# Patient Record
Sex: Female | Born: 1985 | Race: White | Hispanic: No | Marital: Married | Smoking: Never smoker
Health system: Southern US, Community
[De-identification: ages and names within clinical notes are randomized; demographics above are authoritative.]

## PROBLEM LIST (undated history)

## (undated) ENCOUNTER — Inpatient Hospital Stay (HOSPITAL_COMMUNITY): Payer: Self-pay

## (undated) DIAGNOSIS — F419 Anxiety disorder, unspecified: Secondary | ICD-10-CM

## (undated) DIAGNOSIS — F32A Depression, unspecified: Secondary | ICD-10-CM

## (undated) DIAGNOSIS — G43909 Migraine, unspecified, not intractable, without status migrainosus: Secondary | ICD-10-CM

## (undated) DIAGNOSIS — R87629 Unspecified abnormal cytological findings in specimens from vagina: Secondary | ICD-10-CM

## (undated) HISTORY — PX: BUNIONECTOMY: SHX129

## (undated) HISTORY — PX: COLPOSCOPY: SHX161

## (undated) HISTORY — PX: TYMPANOSTOMY TUBE PLACEMENT: SHX32

## (undated) HISTORY — PX: ADENOIDECTOMY: SUR15

## (undated) HISTORY — DX: Unspecified abnormal cytological findings in specimens from vagina: R87.629

## (undated) HISTORY — DX: Depression, unspecified: F32.A

## (undated) HISTORY — PX: EXTERNAL EAR SURGERY: SHX627

---

## 2012-10-22 ENCOUNTER — Encounter: Payer: Self-pay | Admitting: Internal Medicine

## 2012-10-27 ENCOUNTER — Emergency Department (HOSPITAL_COMMUNITY)
Admission: EM | Admit: 2012-10-27 | Discharge: 2012-10-27 | Disposition: A | Discharge status: Home / Self Care | Payer: 59 | Attending: Emergency Medicine | Admitting: Emergency Medicine

## 2012-10-27 ENCOUNTER — Encounter (HOSPITAL_COMMUNITY): Payer: Self-pay | Admitting: *Deleted

## 2012-10-27 DIAGNOSIS — M2569 Stiffness of other specified joint, not elsewhere classified: Secondary | ICD-10-CM | POA: Insufficient documentation

## 2012-10-27 DIAGNOSIS — R059 Cough, unspecified: Secondary | ICD-10-CM | POA: Insufficient documentation

## 2012-10-27 DIAGNOSIS — Z8679 Personal history of other diseases of the circulatory system: Secondary | ICD-10-CM | POA: Insufficient documentation

## 2012-10-27 DIAGNOSIS — J029 Acute pharyngitis, unspecified: Secondary | ICD-10-CM

## 2012-10-27 DIAGNOSIS — R05 Cough: Secondary | ICD-10-CM | POA: Insufficient documentation

## 2012-10-27 DIAGNOSIS — Z791 Long term (current) use of non-steroidal anti-inflammatories (NSAID): Secondary | ICD-10-CM | POA: Insufficient documentation

## 2012-10-27 DIAGNOSIS — M79609 Pain in unspecified limb: Secondary | ICD-10-CM | POA: Insufficient documentation

## 2012-10-27 HISTORY — DX: Migraine, unspecified, not intractable, without status migrainosus: G43.909

## 2012-10-27 NOTE — ED Notes (Signed)
Pt c/o facial swelling to lower jaw line that began last pm; denies difficulty breathing; pt c/o throat discomfort

## 2012-10-28 NOTE — ED Provider Notes (Signed)
CSN: [id number]     Arrival date & time 10/27/12  2201 History   First MD Initiated Contact with Patient 10/27/12 2247     Chief Complaint  Patient presents with  . Facial Swelling   (Consider location/radiation/quality/duration/timing/severity/associated sxs/prior Treatment) HPI  Marieelena Bartko is a 27 y.o. female complaining of  Lower facial swelling, myalgia, dry cough, neck stiffness and sore throat worsening over the course of the last 24 hours. Patient denies difficulty swallowing, fever, or shortness of breath. Patient has taken Benadryl at home with little relief, she thinks she may be having an allergic reaction to her newly started migraine prophylactic medication Lodine. She DC it several days ago.  Past Medical History  Diagnosis Date  . Migraines    Past Surgical History  Procedure Laterality Date  . External ear surgery     No family history on file. History  Substance Use Topics  . Smoking status: Never Smoker   . Smokeless tobacco: Not on file  . Alcohol Use: Yes     Comment: rarely   OB History   Grav Para Term Preterm Abortions TAB SAB Ect Mult Living                 Review of Systems 10 systems reviewed and found to be negative, except as noted in the HPI   Allergies  Review of patient's allergies indicates no known allergies.  Home Medications   Current Outpatient Rx  Name  Route  Sig  Dispense  Refill  . etodolac (LODINE) 400 MG tablet   Oral   Take 400 mg by mouth 2 (two) times daily.         Marland Kitchen levonorgestrel-ethinyl estradiol (AVIANE,ALESSE,LESSINA) 0.1-20 MG-MCG tablet   Oral   Take 1 tablet by mouth daily.          BP 120/75  Pulse 65  Temp(Src) 98.1 F (36.7 C) (Oral)  Resp 20  SpO2 98%  LMP 09/22/2012 Physical Exam  Nursing note and vitals reviewed. Constitutional: She is oriented to person, place, and time. She appears well-developed and well-nourished. No distress.  HENT:  Head: Normocephalic.  Mouth/Throat: Oropharynx  is clear and moist. No oropharyngeal exudate.  No facial swelling, no tenderness to palpation or induration under comment.  No tonsillar hypertrophy, posterior pharynx is erythematous  Eyes: Conjunctivae and EOM are normal. Pupils are equal, round, and reactive to light.  Neck:  Large anterior cervical lymphadenopathy, minimally tender  Cardiovascular: Normal rate, regular rhythm and intact distal pulses.   Pulmonary/Chest: Effort normal and breath sounds normal. No stridor. No respiratory distress. She has no wheezes. She has no rales. She exhibits no tenderness.  Abdominal: Soft. Bowel sounds are normal. She exhibits no distension and no mass. There is no tenderness. There is no rebound and no guarding.  Musculoskeletal: Normal range of motion.  Lymphadenopathy:    She has cervical adenopathy.  Neurological: She is alert and oriented to person, place, and time.  Skin: Skin is warm.  Psychiatric: She has a normal mood and affect.  nervous    ED Course  Procedures (including critical care time) Labs Review Labs Reviewed - No data to display Imaging Review No results found.  MDM   1. Viral pharyngitis    Filed Vitals:   10/27/12 2218 10/27/12 2327  BP: 135/73 120/75  Pulse: 75 65  Temp: 98.1 F (36.7 C) 98.1 F (36.7 C)  TempSrc: Oral Oral  Resp: 18 20  SpO2: 99% 98%  Amie Cowens is a 27 y.o. female with pharyngitis, myalgia, dry cough, and anterior cervical lymphadenopathy, physical exam is not consistent with strep, and this is a simple viral pharyngitis. Patient appears quite nervous I have reassured her that it is a viral syndrome and that she's not having an allergic reaction. I advised her to DC the Lodine until she passes the infection and then she can discuss restarting it with her neurologist.  Pt is hemodynamically stable, appropriate for, and amenable to discharge at this time. Pt verbalized understanding and agrees with care plan. All questions answered.  Outpatient follow-up and specific return precautions discussed.    Note: Portions of this report may have been transcribed using voice recognition software. Every effort was made to ensure accuracy; however, inadvertent computerized transcription errors may be present     Wynetta Emery, PA-C 10/28/12 0126

## 2012-10-28 NOTE — ED Provider Notes (Signed)
Medical screening examination/treatment/procedure(s) were performed by non-physician practitioner and as supervising physician I was immediately available for consultation/collaboration.  Milam Allbaugh L Jayme Mednick, MD 10/28/12 1938 

## 2012-11-15 ENCOUNTER — Encounter: Payer: Self-pay | Admitting: Internal Medicine

## 2012-11-19 ENCOUNTER — Ambulatory Visit (INDEPENDENT_AMBULATORY_CARE_PROVIDER_SITE_OTHER): Payer: 59 | Admitting: Internal Medicine

## 2012-11-19 ENCOUNTER — Encounter: Payer: Self-pay | Admitting: Internal Medicine

## 2012-11-19 ENCOUNTER — Other Ambulatory Visit: Payer: Self-pay | Admitting: Gastroenterology

## 2012-11-19 ENCOUNTER — Other Ambulatory Visit (INDEPENDENT_AMBULATORY_CARE_PROVIDER_SITE_OTHER): Payer: 59

## 2012-11-19 VITALS — BP 126/70 | HR 84 | Ht 68.0 in | Wt 143.0 lb

## 2012-11-19 DIAGNOSIS — R110 Nausea: Secondary | ICD-10-CM

## 2012-11-19 DIAGNOSIS — G43709 Chronic migraine without aura, not intractable, without status migrainosus: Secondary | ICD-10-CM

## 2012-11-19 DIAGNOSIS — R1013 Epigastric pain: Secondary | ICD-10-CM

## 2012-11-19 DIAGNOSIS — Z791 Long term (current) use of non-steroidal anti-inflammatories (NSAID): Secondary | ICD-10-CM

## 2012-11-19 DIAGNOSIS — IMO0002 Reserved for concepts with insufficient information to code with codable children: Secondary | ICD-10-CM

## 2012-11-19 LAB — CBC
MCHC: 34.6 g/dL (ref 30.0–36.0)
RDW: 12.8 % (ref 11.5–14.6)
WBC: 5.4 10*3/uL (ref 4.5–10.5)

## 2012-11-19 LAB — COMPREHENSIVE METABOLIC PANEL
ALT: 44 U/L — ABNORMAL HIGH (ref 0–35)
AST: 25 U/L (ref 0–37)
CO2: 28 mEq/L (ref 19–32)
Calcium: 9.9 mg/dL (ref 8.4–10.5)
Chloride: 109 mEq/L (ref 96–112)
GFR: 118.31 mL/min (ref >60.00)
Sodium: 141 mEq/L (ref 135–145)
Total Bilirubin: 0.6 mg/dL (ref 0.3–1.2)
Total Protein: 6.8 g/dL (ref 6.0–8.3)

## 2012-11-19 LAB — TSH: TSH: 0.46 u[IU]/mL (ref 0.35–5.50)

## 2012-11-19 MED ORDER — HYOSCYAMINE SULFATE 0.125 MG SL SUBL: 0.1250 mg | SUBLINGUAL_TABLET | SUBLINGUAL | Status: DC | PRN

## 2012-11-19 MED ORDER — PANTOPRAZOLE SODIUM 40 MG PO TBEC: 40.0000 mg | DELAYED_RELEASE_TABLET | Freq: Two times a day (BID) | ORAL | Status: DC

## 2012-11-19 MED ORDER — PANTOPRAZOLE SODIUM 40 MG PO TBEC: 40.0000 mg | DELAYED_RELEASE_TABLET | Freq: Every day | ORAL | Status: DC

## 2012-11-19 NOTE — Patient Instructions (Addendum)
Your physician has requested that you go to the basement for the following lab work before leaving today: CBC. Celiac panel, TSH, CMP  We have sent the following medications to your pharmacy for you to pick up at your convenience: Protonics, take twice a day with meals  Follow up in office in 8 weeks                                               We are excited to introduce MyChart, a new best-in-class service that provides you online access to important information in your electronic medical record. We want to make it easier for you to view your health information - all in one secure location - when and where you need it. We expect MyChart will enhance the quality of care and service we provide.  When you register for MyChart, you can:    View your test results.    Request appointments and receive appointment reminders via email.    Request medication renewals.    View your medical history, allergies, medications and immunizations.    Communicate with your physician's office through a password-protected site.    Conveniently print information such as your medication lists.  To find out if MyChart is right for you, please talk to a member of our clinical staff today. We will gladly answer your questions about this free health and wellness tool.  If you are age 27 or older and want a member of your family to have access to your record, you must provide written consent by completing a proxy form available at our office. Please speak to our clinical staff about guidelines regarding accounts for patients younger than age 27.  As you activate your MyChart account and need any technical assistance, please call the MyChart technical support line at [phone]-CHART [phone]) or email your question to [email or web address]. If you email your question(s), please include your name, a return phone number and the best time to reach you.  If you have non-urgent health-related questions, you  can send a message to our office through MyChart at [email or web address]. If you have a medical emergency, call 911.  Thank you for using MyChart as your new health and wellness resource!   MyChart licensed from [employer],  [id number]. Patents Pending.

## 2012-11-19 NOTE — Progress Notes (Signed)
Patient ID: Taylor Choi, female   DOB: [birth year], 27 y.o.   MRN: [id number] HPI: Taylor Choi is a 27 yo female with PMH of chronic migraines, followed at [facility], who is seen for evaluation of ongoing abdominal pain. The patient is here alone today. She reports "stomach pain" over the last year now occurring more frequently. She reports this can be quite severe. It is significantly worse after eating, though she knows she can occur on an anti-stomach. She has associated nausea but no vomiting. She reports the pain can be sharp and occasionally radiate through to her back. The pain she reports is worse above her umbilicus and usually in the epigastrium. It seems to worsen with stress. She has not tried any over-the-counter medications. She does report abdominal bloating. Overall appetite has been good though at times she avoids eating due to to fear of the pain. She reports normal bowel habits for her, though she reports somewhat say she is constipated. She reports having a bowel movement every 4-5 days. Bowel movement does not affect her abdominal pain. She denies blood in her stool or melena. She does have chronic migraines which are occurring on a daily basis. She is followed by Dr. Thomasena Edis her [facility] neurologist. She's tried multiple prophylactic medications in the past none of which helped. She even went through biofeedback. He reports significant stress at work due to her high paced job, but for now she plans to continue this job without change in career. Last period was normal occurring around 10/30/2012.  She is using daily NSAIDs for her chronic migraines. She was taken etodolac 400 mg twice daily for months, but stopped in early September when she developed sore throat and URI symptoms. There was some question of whether this was an allergic reaction to etodolac, but she reports her doctors determined it was a viral infection. She has not resumed the medication yet but she is using at least 800  mg of ibuprofen daily  She reports a family history of colon polyps in her father and diverticulosis/diverticulitis in her mother requiring partial colon resection. The family history of celiac disease, colon cancer or IBD (to her knowledge)  Past Medical History  Diagnosis Date  . Migraines     Past Surgical History  Procedure Laterality Date  . External ear surgery      Current Outpatient Prescriptions  Medication Sig Dispense Refill  . etodolac (LODINE) 400 MG tablet Take 400 mg by mouth 2 (two) times daily.      . pantoprazole (PROTONIX) 40 MG tablet Take 1 tablet (40 mg total) by mouth 2 (two) times daily before a meal.  60 tablet  6   No current facility-administered medications for this visit.    No Known Allergies  Family History  Problem Relation Age of Onset  . Diverticulitis Mother   . Diverticulitis Maternal Grandmother   . Colon polyps Father   . Diabetes Father   . Diverticulitis Cousin   . Colon cancer Neg Hx     History  Substance Use Topics  . Smoking status: Never Smoker   . Smokeless tobacco: Never Used  . Alcohol Use: No     Comment: rarely    ROS: As per history of present illness, otherwise negative  BP 126/70  Pulse 84  Ht 5\' 8"  (1.727 m)  Wt 143 lb (64.864 kg)  BMI 21.75 kg/m2  SpO2 98%  LMP 10/30/2012 Constitutional: Well-developed and well-nourished. No distress. HEENT: Normocephalic and atraumatic. Oropharynx is  clear and moist. No oropharyngeal exudate. Conjunctivae are normal.  No scleral icterus. Neck: Neck supple. Trachea midline. Cardiovascular: Normal rate, regular rhythm and intact distal pulses. No M/R/G Pulmonary/chest: Effort normal and breath sounds normal. No wheezing, rales or rhonchi. Abdominal: Soft, mild to moderate epigastric tenderness without rebound or guarding, nondistended. Bowel sounds active throughout. There are no masses palpable. No hepatosplenomegaly. Extremities: no clubbing, cyanosis, or  edema Lymphadenopathy: No cervical adenopathy noted. Neurological: Alert and oriented to person place and time. Skin: Skin is warm and dry. No rashes noted. Psychiatric: Normal mood and affect. Behavior is normal.  RELEVANT LABS --Labs ordered today CBC, CMP, celiac panel, TSH  ASSESSMENT/PLAN: 27 yo female with PMH of chronic migraines, followed at [facility], who is seen for evaluation of ongoing abdominal pain.  1.  Epigastric abd pain, nausea -- peptic ulcer disease or NSAID-related gastroduodenitis is at the top of the differential currently. Other considerations include biliary dyskinesia, celiac disease, IBS, or chronic constipation. I have recommended labs today to include celiac panel and thyroid-stimulating hormone. I would like to start her on PPI twice daily or one month and reassess her symptoms. If this improves her abdominal pain, and we will continue PPI on a once daily basis while she is using NSAIDs. If her pain fails to completely improve I will plan upper endoscopy. She is aware of this recommendation. Also if her symptoms fail to improve we will perform abdominal ultrasound to exclude gallstones. I would like to see her back in 4-6 weeks for reassessment, but sooner if things worsen or fail to improve. She understands and is happy with this plan. I will also give her prescription for Levsin to be used as needed for cramping abdominal pain.  2.  Migraines -- daily problem being followed by neurology. Currently she is using NSAIDs as abortive therapy, and we'll use PPI to protect her gastric and duodenal mucosa as described above. She asked about gluten and how it might relate to migraines. Certainly some patients have gluten intolerance without true celiac disease. Some patients with migraines notice improvement with gluten-free diet.  We will perform PPI trial before making any significant dietary modifications as it relates to her migraines

## 2013-01-13 ENCOUNTER — Encounter: Payer: Self-pay | Admitting: Internal Medicine

## 2013-01-15 ENCOUNTER — Ambulatory Visit (INDEPENDENT_AMBULATORY_CARE_PROVIDER_SITE_OTHER): Payer: 59 | Admitting: Internal Medicine

## 2013-01-15 ENCOUNTER — Other Ambulatory Visit (INDEPENDENT_AMBULATORY_CARE_PROVIDER_SITE_OTHER): Payer: 59

## 2013-01-15 ENCOUNTER — Encounter: Payer: Self-pay | Admitting: Internal Medicine

## 2013-01-15 VITALS — BP 114/70 | HR 68 | Ht 69.0 in | Wt 143.0 lb

## 2013-01-15 DIAGNOSIS — R7402 Elevation of levels of lactic acid dehydrogenase (LDH): Secondary | ICD-10-CM

## 2013-01-15 DIAGNOSIS — R1013 Epigastric pain: Secondary | ICD-10-CM

## 2013-01-15 DIAGNOSIS — R7401 Elevation of levels of liver transaminase levels: Secondary | ICD-10-CM

## 2013-01-15 DIAGNOSIS — R110 Nausea: Secondary | ICD-10-CM

## 2013-01-15 LAB — COMPREHENSIVE METABOLIC PANEL
ALT: 37 U/L — ABNORMAL HIGH (ref 0–35)
CO2: 26 mEq/L (ref 19–32)
Calcium: 10 mg/dL (ref 8.4–10.5)
Chloride: 105 mEq/L (ref 96–112)
GFR: 118.17 mL/min (ref >60.00)
Glucose, Bld: 91 mg/dL (ref 70–99)
Sodium: 136 mEq/L (ref 135–145)
Total Bilirubin: 0.8 mg/dL (ref 0.3–1.2)
Total Protein: 7.3 g/dL (ref 6.0–8.3)

## 2013-01-15 LAB — LIPASE: Lipase: 23 U/L (ref 11.0–59.0)

## 2013-01-15 MED ORDER — PANTOPRAZOLE SODIUM 40 MG PO TBEC: 40.0000 mg | DELAYED_RELEASE_TABLET | Freq: Every day | ORAL | Status: DC

## 2013-01-15 MED ORDER — HYOSCYAMINE SULFATE 0.125 MG SL SUBL: 0.1250 mg | SUBLINGUAL_TABLET | SUBLINGUAL | Status: DC | PRN

## 2013-01-15 NOTE — Patient Instructions (Signed)
You have been scheduled for an abdominal ultrasound at [facility] (1st floor of hospital) on 10/20/2012 at 8:30am. Please arrive 15 minutes prior to your appointment for registration. Make certain not to have anything to eat or drink 6 hours prior to your appointment. Should you need to reschedule your appointment, please contact radiology at [phone]. This test typically takes about 30 minutes to perform.   Continue taking levsin. Decrease Protonix to once daily.  Dr. Rhea Belton recommends taking Lactaid ( you can purchase this over the counter) wit any meal containing Lactose in it.  Follow up in office in 4-6 weeks.                                                We are excited to introduce MyChart, a new best-in-class service that provides you online access to important information in your electronic medical record. We want to make it easier for you to view your health information - all in one secure location - when and where you need it. We expect MyChart will enhance the quality of care and service we provide.  When you register for MyChart, you can:    View your test results.    Request appointments and receive appointment reminders via email.    Request medication renewals.    View your medical history, allergies, medications and immunizations.    Communicate with your physician's office through a password-protected site.    Conveniently print information such as your medication lists.  To find out if MyChart is right for you, please talk to a member of our clinical staff today. We will gladly answer your questions about this free health and wellness tool.  If you are age 27 or older and want a member of your family to have access to your record, you must provide written consent by completing a proxy form available at our office. Please speak to our clinical staff about guidelines regarding accounts for patients younger than age 89.  As you activate your MyChart account  and need any technical assistance, please call the MyChart technical support line at ([phone]) or email your question to [email or web address]. If you email your question(s), please include your name, a return phone number and the best time to reach you.  If you have non-urgent health-related questions, you can send a message to our office through MyChart at [email or web address]. If you have a medical emergency, call 911.  Thank you for using MyChart as your new health and wellness resource!   MyChart licensed from [employer],  [id number]. Patents Pending.

## 2013-01-15 NOTE — Progress Notes (Signed)
Subjective:    Patient ID: Taylor Choi, female    DOB: [birth year], 27 y.o.   MRN: [id number]  HPI Taylor Choi is a 27 yo female with PMH of migraines who is seen on 11/19/2012 to evaluate epigastric abdominal pain with nausea and is here for followup. At her last visit she was started on pantoprazole 40 mg twice daily with a working diagnosis of NSAID-related gastroduodenitis.  She reports her pain has improved and is no longer associated with eating. She is still having episodic upper abdominal pain which seems to happen" random" fashion. This pain does not relate to eating at this point. It can last one to 2 hours. She's tried Levsin for this pain it has helped a little. She has stopped using NSAIDs and is trying to use Tylenol for her migraines. She was previously described etodolac but never started using it. Her nausea has completely resolved. Bowel habits have not changed. She has noticed, which is long-standing and failed to mention last time, if she develops urgent loose stools after drinking milk. This also happens with coffee, but she reports she uses creamer. This has happened for years. She has tried to make some changes to her diet including cutting back on deli meats and breads. No fevers or chills. Pain is usually not nocturnal. No rashes. No mouth sores or ulcers.  Review of Systems As per history of present illness, otherwise negative  Current Medications, Allergies, Past Medical History, Past Surgical History, Family History and Social History were reviewed in [employer] record.     Objective:   Physical Exam BP 114/70  Pulse 68  Ht 5\' 9"  (1.753 m)  Wt 143 lb (64.864 kg)  BMI 21.11 kg/m2  LMP 12/14/2012 Constitutional: Well-developed and well-nourished. No distress. HEENT: Normocephalic and atraumatic. Oropharynx is clear and moist. No oropharyngeal exudate. Conjunctivae are normal.  No scleral icterus. Neck: Neck supple. Trachea  midline. Cardiovascular: Normal rate, regular rhythm and intact distal pulses. No M/R/G Pulmonary/chest: Effort normal and breath sounds normal. No wheezing, rales or rhonchi. Abdominal: Soft, epigastric and upper abdominal tenderness with deep palpation without rebound or guarding, nondistended. Bowel sounds active throughout. There are no masses palpable. No hepatosplenomegaly. Extremities: no clubbing, cyanosis, or edema Neurological: Alert and oriented to person place and time. Psychiatric: Normal mood and affect. Behavior is normal.  CBC    Component Value Date/Time   WBC 5.4 11/19/2012 0944   RBC 4.43 11/19/2012 0944   HGB 13.7 11/19/2012 0944   HCT 39.5 11/19/2012 0944   PLT 247.0 11/19/2012 0944   MCV 89.3 11/19/2012 0944   MCHC 34.6 11/19/2012 0944   RDW 12.8 11/19/2012 0944    CMP     Component Value Date/Time   NA 141 11/19/2012 0944   K 4.4 11/19/2012 0944   CL 109 11/19/2012 0944   CO2 28 11/19/2012 0944   GLUCOSE 88 11/19/2012 0944   BUN 10 11/19/2012 0944   CREATININE 0.6 11/19/2012 0944   CALCIUM 9.9 11/19/2012 0944   PROT 6.8 11/19/2012 0944   ALBUMIN 4.1 11/19/2012 0944   AST 25 11/19/2012 0944   ALT 44* 11/19/2012 0944   ALKPHOS 51 11/19/2012 0944   BILITOT 0.6 11/19/2012 0944    Celiac panel -- negative  TSH - normal    Assessment & Plan:   27 yo female with PMH of migraines who is seen on 11/19/2012 to evaluate epigastric abdominal pain with nausea and is here for followup.  1.  Epigastric/upper abd  pain -- her pain has improved to some degree though she continues to have episodic upper abdominal pain without clear trigger. This is despite 2 months of twice a day PPI.  She also has cut out NSAIDs. At this point given her symptoms have improved but yet continued to some degree, I have recommended an abdominal ultrasound. This will help rule out gallstones. I will reduce her pantoprazole to once daily. She can continue to use Levsin as needed. I would like to repeat labs  today to include a CMP, lipase and H. pylori antibody. If her ultrasound is negative and her symptoms fail to improve further, my recommendation will be for upper endoscopy. She understands this plan and is happy  2.  Elevated ALT -- very mild elevation in ALT only. Unclear etiology, I am rechecking liver enzymes today and getting [place] as mentioned above.

## 2013-01-20 ENCOUNTER — Other Ambulatory Visit: Payer: 59

## 2013-01-20 ENCOUNTER — Ambulatory Visit (HOSPITAL_COMMUNITY)
Admission: RE | Admit: 2013-01-20 | Discharge: 2013-01-20 | Disposition: A | Discharge status: Home / Self Care | Payer: 59 | Source: Ambulatory Visit | Attending: Internal Medicine | Admitting: Internal Medicine

## 2013-01-20 ENCOUNTER — Other Ambulatory Visit: Payer: Self-pay | Admitting: Gastroenterology

## 2013-01-20 DIAGNOSIS — R110 Nausea: Secondary | ICD-10-CM

## 2013-01-20 DIAGNOSIS — R1013 Epigastric pain: Secondary | ICD-10-CM

## 2013-01-20 DIAGNOSIS — R109 Unspecified abdominal pain: Secondary | ICD-10-CM

## 2013-01-21 LAB — HELICOBACTER PYLORI  SPECIAL ANTIGEN

## 2013-02-17 ENCOUNTER — Encounter: Payer: Self-pay | Admitting: Internal Medicine

## 2013-02-24 ENCOUNTER — Ambulatory Visit (INDEPENDENT_AMBULATORY_CARE_PROVIDER_SITE_OTHER): Payer: 59 | Admitting: Internal Medicine

## 2013-02-24 ENCOUNTER — Encounter: Payer: Self-pay | Admitting: Internal Medicine

## 2013-02-24 VITALS — BP 92/60 | HR 80 | Ht 69.0 in | Wt 141.5 lb

## 2013-02-24 DIAGNOSIS — R1013 Epigastric pain: Secondary | ICD-10-CM

## 2013-02-24 DIAGNOSIS — E739 Lactose intolerance, unspecified: Secondary | ICD-10-CM | POA: Insufficient documentation

## 2013-02-24 NOTE — Patient Instructions (Signed)
Follow up if your epigastric pain or nausea return.                                               We are excited to introduce MyChart, a new best-in-class service that provides you online access to important information in your electronic medical record. We want to make it easier for you to view your health information - all in one secure location - when and where you need it. We expect MyChart will enhance the quality of care and service we provide.  When you register for MyChart, you can:    View your test results.    Request appointments and receive appointment reminders via email.    Request medication renewals.    View your medical history, allergies, medications and immunizations.    Communicate with your physician's office through a password-protected site.    Conveniently print information such as your medication lists.  To find out if MyChart is right for you, please talk to a member of our clinical staff today. We will gladly answer your questions about this free health and wellness tool.  If you are age 27 or older and want a member of your family to have access to your record, you must provide written consent by completing a proxy form available at our office. Please speak to our clinical staff about guidelines regarding accounts for patients younger than age 26.  As you activate your MyChart account and need any technical assistance, please call the MyChart technical support line at [phone] 83-CHART [phone]) or email your question to [email or web address]. If you email your question(s), please include your name, a return phone number and the best time to reach you.  If you have non-urgent health-related questions, you can send a message to our office through MyChart at [email or web address]. If you have a medical emergency, call 911.  Thank you for using MyChart as your new health and wellness resource!   MyChart licensed from [employer],   [id number]. Patents Pending.

## 2013-02-24 NOTE — Progress Notes (Signed)
   Subjective:    Patient ID: Taylor Choi, female    DOB: Mar 03, 1985, 27 y.o.   MRN: [id number]  HPI Taylor Choi is a 27 yo female with PMH of migraines who is seen in followup for epigastric pain with nausea. At her last visit she had a complete abdominal ultrasound which was normal.  He also decreased her pantoprazole to 40 mg once daily. She has continued to avoid NSAIDs, though she is still having migraines. She occasionally uses Tylenol or prescription triptan for her headaches. She reports further improvement in her epigastric pain. No further nausea after eating. She did have an episode of diffuse abdominal discomfort with bloating and loose stools on Christmas Eve. She reports this was after eating the soup that contained milk at her mother's home.  She was unaware of the soup contained milk before this meal.  Other than that she feels better overall. Appetite is good. No vomiting. No heartburn. No blood in her stool or melena.   Review of Systems As per history of present illness, otherwise negative  Current Medications, Allergies, Past Medical History, Past Surgical History, Family History and Social History were reviewed in [employer] record.     Objective:   Physical Exam BP 92/60  Pulse 80  Ht 5\' 9"  (1.753 m)  Wt 141 lb 8 oz (64.184 kg)  BMI 20.89 kg/m2 Constitutional: Well-developed and well-nourished. No distress. HEENT: Normocephalic and atraumatic.No scleral icterus. Psychiatric: Normal mood and affect. Behavior is normal.  CLINICAL DATA:  Epigastric pain   EXAM: ULTRASOUND ABDOMEN COMPLETE -- 01/21/2013   COMPARISON:  None.   FINDINGS: Gallbladder   No gallstones or wall thickening visualized. No sonographic Murphy sign noted.   Common bile duct   Diameter: 2.1 mm   Liver   No focal lesion identified. Within normal limits in parenchymal echogenicity.   IVC   No abnormality visualized.   Pancreas   Visualized portion  unremarkable.   Spleen   Size and appearance within normal limits.   Right Kidney   Length: 10.9 cm. Echogenicity within normal limits. No mass or hydronephrosis visualized.   Left Kidney   Length: 10.9 cm. Echogenicity within normal limits. No mass or hydronephrosis visualized.   Abdominal aorta   No aneurysm visualized.   IMPRESSION: Normal abdominal ultrasound.     Assessment & Plan:  27 yo female with PMH of migraines who is seen in followup for epigastric pain with nausea.  1.  Epigastric pain -- further improvement to this point. Ultrasound was reviewed and negative. The working diagnosis was gastroduodenitis, perhaps PUD associated with NSAID use. This has resolved and she is avoiding NSAIDs. Given her improvement I do not think upper endoscopy or further workup is warranted. She understands and agrees with this plan.  She will complete her current prescription for pantoprazole and then discontinue this medication. If her epigastric pain or nausea return after discontinuing this medication, I asked that she notify me. She voices understanding.  2.  Lactose intolerance -- I do think she has lactose intolerance and we discussed this again today. She understands that the lactose containing meal can contribute to abdominal discomfort, pain, bloating, loose stools. She can use over-the-counter Lactaid tablets if she eats lactose-containing food or drinks milk.  Otherwise she will work to avoid lactose altogether.  She can followup as needed, and as above

## 2013-04-21 LAB — OB RESULTS CONSOLE ANTIBODY SCREEN: Antibody Screen: NEGATIVE

## 2013-04-21 LAB — OB RESULTS CONSOLE ABO/RH: RH Type: POSITIVE

## 2013-04-21 LAB — OB RESULTS CONSOLE GC/CHLAMYDIA
CHLAMYDIA, DNA PROBE: NEGATIVE
GC PROBE AMP, GENITAL: NEGATIVE

## 2013-04-21 LAB — OB RESULTS CONSOLE HIV ANTIBODY (ROUTINE TESTING): HIV: NONREACTIVE

## 2013-04-21 LAB — OB RESULTS CONSOLE RPR: RPR: NONREACTIVE

## 2013-04-21 LAB — OB RESULTS CONSOLE RUBELLA ANTIBODY, IGM: RUBELLA: IMMUNE

## 2013-04-21 LAB — OB RESULTS CONSOLE HEPATITIS B SURFACE ANTIGEN: Hepatitis B Surface Ag: NEGATIVE

## 2013-08-25 ENCOUNTER — Encounter (HOSPITAL_COMMUNITY): Payer: Self-pay | Admitting: *Deleted

## 2013-08-25 ENCOUNTER — Inpatient Hospital Stay (HOSPITAL_COMMUNITY): Payer: 59

## 2013-08-25 ENCOUNTER — Inpatient Hospital Stay (HOSPITAL_COMMUNITY)
Admission: AD | Admit: 2013-08-25 | Discharge: 2013-08-25 | Disposition: A | Discharge status: Home / Self Care | Payer: 59 | Source: Ambulatory Visit | Attending: Obstetrics and Gynecology | Admitting: Obstetrics and Gynecology

## 2013-08-25 DIAGNOSIS — R071 Chest pain on breathing: Secondary | ICD-10-CM | POA: Insufficient documentation

## 2013-08-25 DIAGNOSIS — R0602 Shortness of breath: Secondary | ICD-10-CM | POA: Insufficient documentation

## 2013-08-25 DIAGNOSIS — O9989 Other specified diseases and conditions complicating pregnancy, childbirth and the puerperium: Principal | ICD-10-CM

## 2013-08-25 DIAGNOSIS — O99891 Other specified diseases and conditions complicating pregnancy: Secondary | ICD-10-CM | POA: Insufficient documentation

## 2013-08-25 NOTE — MAU Provider Note (Signed)
Chief Complaint:  Shortness of Breath, Pain under left breast  and Pain down entire left arm    First Provider Initiated Contact with Patient 08/25/13 1034      HPI: Rod MaeLindsay A Hoey is a 28 y.o. G1P0 at 3527w3d who presents to maternity admissions reporting shortness of breathe and pain under left breast and into left arm.  Pt reports she is not currently coughing, but had URI over the weekend with coughing.  She has pain when lifting her left arm.  She reports good fetal movement, denies regular contractions, LOF, vaginal bleeding, vaginal itching/burning, urinary symptoms, h/a, dizziness, n/v, or fever/chills.    Past Medical History: Past Medical History  Diagnosis Date  . Migraines     Past obstetric history: OB History  Gravida Para Term Preterm AB SAB TAB Ectopic Multiple Living  1             # Outcome Date GA Lbr Len/2nd Weight Sex Delivery Anes PTL Lv  1 CUR               Past Surgical History: Past Surgical History  Procedure Laterality Date  . External ear surgery      Family History: Family History  Problem Relation Age of Onset  . Diverticulitis Mother   . Diverticulitis Maternal Grandmother   . Colon polyps Father   . Diabetes Father   . Diverticulitis Cousin   . Colon cancer Neg Hx     Social History: History  Substance Use Topics  . Smoking status: Never Smoker   . Smokeless tobacco: Never Used  . Alcohol Use: No     Comment: rarely    Allergies: No Known Allergies  Meds:  Prescriptions prior to admission  Medication Sig Dispense Refill  . Prenatal Vit-Fe Fumarate-FA (PRENATAL MULTIVITAMIN) TABS tablet Take 1 tablet by mouth daily at 12 noon.        ROS: Pertinent findings in history of present illness.  Physical Exam  Blood pressure 116/66, pulse 91, temperature 98.1 F (36.7 C), temperature source Oral, resp. rate 16, height 5\' 9"  (1.753 m), weight 71.215 kg (157 lb), last menstrual period 02/28/2013, SpO2 97.00%. GENERAL:  Well-developed, well-nourished female in no acute distress.  HEENT: normocephalic HEART: normal rate, rhythm, heart sounds RESP: normal effort, lung sounds clear and equal bilaterally CHEST: tenderness to palpation at second intercostal, midclavicular, also tenderness with raising of left arm ABDOMEN: Soft, non-tender, gravid appropriate for gestational age EXTREMITIES: Nontender, no edema NEURO: alert and oriented    FHT:  Baseline 145, moderate variability, accelerations present, no decelerations Contractions: None on toco or to palpation   EKG normal  Chest X-Ray pending  Report to Joseph BerkshireJulie Ethier, PA     Medication List    ASK your doctor about these medications       prenatal multivitamin Tabs tablet  Take 1 tablet by mouth daily at 12 noon.        Sharen CounterLisa Leftwich-Kirby Certified Nurse-Midwife 08/25/2013 11:23 AM  MDM Called Dr. Henderson Cloudomblin with CXR results. He will come to MAU to evaluate the patient for discharge  A: SIUP at 7627w3d Chest pain SOB  P: Dr. Henderson Cloudomblin in MAU to assess patient and discharged home  Freddi StarrJulie N Ethier, PA-C  08/25/2013 1:17 PM

## 2013-08-25 NOTE — Progress Notes (Signed)
28 yo G1P0 at 25 3/7 wks C/O pain in left chest for about 2 days, waxes and wanes. Had some cough over weekend although not a bad cough. No fever, no pain in legs. Some "SOB" in that she needed to take a deep breath and fill her lungs, no wheezing or productive cough.  PE , patient smiling in NAD        Lungs CTA        Cor RRR        Abd-epigastrum NT, uterus soft and NT        LE-bilat-no cords, negative Homan's        Chest-tender along left costal margin and in intercostal spaces below breast. Also mildly tender along left pectoralis.  Left breast tissue is NT without masses, no erythema or retraction  O2 sat 97% RA EKG NSR CXR WNL  A: Costo chondral pain  P: Tylenol prn      Heat to chest prn      Call if sxs worsen or change      FU in office as scheduled

## 2013-08-25 NOTE — MAU Note (Signed)
Patient presents with complaints of SOB, pain under left breast and down her entire left arm since Friday.

## 2013-08-25 NOTE — MAU Note (Signed)
Urine is in lab 

## 2013-08-25 NOTE — MAU Note (Signed)
Assist Dr. Henderson Cloudomblin with breast exam

## 2013-08-25 NOTE — Discharge Instructions (Signed)
Per Dr. Henderson Cloudomblin: 1. Tylenol as needed for pain. 2. Heating pad for comfort. 3. Call if symptoms worsen.

## 2013-11-17 ENCOUNTER — Encounter (HOSPITAL_COMMUNITY): Payer: Self-pay | Admitting: *Deleted

## 2013-11-17 ENCOUNTER — Inpatient Hospital Stay (HOSPITAL_COMMUNITY)
Admission: AD | Admit: 2013-11-17 | Discharge: 2013-11-17 | Disposition: A | Discharge status: Home / Self Care | Payer: 59 | Source: Ambulatory Visit | Attending: Obstetrics and Gynecology | Admitting: Obstetrics and Gynecology

## 2013-11-17 DIAGNOSIS — O479 False labor, unspecified: Secondary | ICD-10-CM | POA: Insufficient documentation

## 2013-11-17 DIAGNOSIS — O99891 Other specified diseases and conditions complicating pregnancy: Secondary | ICD-10-CM | POA: Diagnosis present

## 2013-11-17 LAB — POCT FERN TEST: POCT Fern Test: NEGATIVE

## 2013-11-17 NOTE — Discharge Instructions (Signed)

## 2013-11-17 NOTE — MAU Note (Signed)
Patient states that about 1600 she had a watery ring of fluid on a panty liner. Feels constantly wet. Having irregular cramping. Reports good fetal movement.

## 2013-11-17 NOTE — MAU Provider Note (Signed)
S: 28 y.o. G1P0  presents to MAU with leakage of clear fluid. She reports a ring of clear fluid on her pantyliner today, not enough to soak the pantyliner or through to her underwear.  She also reports an increase in cramping and back pain today, but nothing regular.  She reports good fetal movement, denies vaginal bleeding, vaginal itching/burning, urinary symptoms, h/a, dizziness, n/v, or fever/chills.    O: BP 132/76  Pulse 85  Temp(Src) 99.2 F (37.3 C) (Oral)  Resp 16  Ht  (1.727 m)  Wt 75.569 kg (166 lb 9.6 oz)  BMI 25.34 kg/m2  SpO2 100%  LMP 02/28/2013  Dilation: Closed Effacement (%): 50 Cervical Position: Posterior Exam by:: Leftwich-Kirby CNM  Speculum Exam with negative pooling with valsalva, slide taken with negative ferning  A: Threatened labor at term  P: Consult Dr Renaldo Fiddler D/C home with labor precautions  Sharen Counter Certified Nurse-Midwife

## 2013-12-04 ENCOUNTER — Telehealth (HOSPITAL_COMMUNITY): Payer: Self-pay | Admitting: *Deleted

## 2013-12-04 ENCOUNTER — Encounter (HOSPITAL_COMMUNITY): Payer: Self-pay | Admitting: *Deleted

## 2013-12-04 LAB — OB RESULTS CONSOLE GBS: GBS: NEGATIVE

## 2013-12-04 NOTE — Telephone Encounter (Signed)
Preadmission screen  

## 2013-12-11 ENCOUNTER — Encounter (HOSPITAL_COMMUNITY): Payer: Self-pay

## 2013-12-11 ENCOUNTER — Inpatient Hospital Stay (HOSPITAL_COMMUNITY)
Admission: RE | Admit: 2013-12-11 | Discharge: 2013-12-14 | DRG: 766 | Disposition: A | Discharge status: Home / Self Care | Payer: 59 | Source: Ambulatory Visit | Attending: Obstetrics and Gynecology | Admitting: Obstetrics and Gynecology

## 2013-12-11 DIAGNOSIS — Z3A41 41 weeks gestation of pregnancy: Secondary | ICD-10-CM | POA: Diagnosis present

## 2013-12-11 DIAGNOSIS — Z98891 History of uterine scar from previous surgery: Secondary | ICD-10-CM

## 2013-12-11 DIAGNOSIS — O480 Post-term pregnancy: Principal | ICD-10-CM | POA: Diagnosis present

## 2013-12-11 DIAGNOSIS — Z833 Family history of diabetes mellitus: Secondary | ICD-10-CM | POA: Diagnosis not present

## 2013-12-11 LAB — TYPE AND SCREEN
ABO/RH(D): O POS
Antibody Screen: NEGATIVE

## 2013-12-11 LAB — CBC
HEMATOCRIT: 34.6 % — AB (ref 36.0–46.0)
HEMOGLOBIN: 12.3 g/dL (ref 12.0–15.0)
MCH: 32.3 pg (ref 26.0–34.0)
MCHC: 35.5 g/dL (ref 30.0–36.0)
MCV: 90.8 fL (ref 78.0–100.0)
Platelets: 212 10*3/uL (ref 150–400)
RBC: 3.81 MIL/uL — ABNORMAL LOW (ref 3.87–5.11)
RDW: 13 % (ref 11.5–15.5)
WBC: 11.5 10*3/uL — ABNORMAL HIGH (ref 4.0–10.5)

## 2013-12-11 MED ORDER — OXYCODONE-ACETAMINOPHEN 5-325 MG PO TABS: 2.0000 | ORAL_TABLET | ORAL | Status: DC | PRN

## 2013-12-11 MED ORDER — OXYTOCIN BOLUS FROM INFUSION: 500.0000 mL | INTRAVENOUS | Status: DC

## 2013-12-11 MED ORDER — LACTATED RINGERS IV SOLN: 500.0000 mL | INTRAVENOUS | Status: DC | PRN

## 2013-12-11 MED ORDER — OXYTOCIN 40 UNITS IN LACTATED RINGERS INFUSION - SIMPLE MED: 62.5000 mL/h | INTRAVENOUS | Status: DC

## 2013-12-11 MED ORDER — FLEET ENEMA 7-19 GM/118ML RE ENEM: 1.0000 | ENEMA | RECTAL | Status: DC | PRN

## 2013-12-11 MED ORDER — ACETAMINOPHEN 325 MG PO TABS: 650.0000 mg | ORAL_TABLET | ORAL | Status: DC | PRN

## 2013-12-11 MED ORDER — ONDANSETRON HCL 4 MG/2ML IJ SOLN
4.0000 mg | Freq: Four times a day (QID) | INTRAMUSCULAR | Status: DC | PRN
Start: 2013-12-11 — End: 2013-12-12

## 2013-12-11 MED ORDER — MISOPROSTOL 25 MCG QUARTER TABLET
25.0000 ug | ORAL_TABLET | ORAL | Status: DC | PRN
  Administered 2013-12-11: 25 ug via VAGINAL
  Filled 2013-12-11 (×3): qty 0.25

## 2013-12-11 MED ORDER — LACTATED RINGERS IV SOLN
INTRAVENOUS | Status: DC
  Administered 2013-12-11: 21:00:00 via INTRAVENOUS

## 2013-12-11 MED ORDER — CITRIC ACID-SODIUM CITRATE 334-500 MG/5ML PO SOLN
30.0000 mL | ORAL | Status: DC | PRN
  Administered 2013-12-12: 30 mL via ORAL
  Filled 2013-12-11: qty 15

## 2013-12-11 MED ORDER — LIDOCAINE HCL (PF) 1 % IJ SOLN: 30.0000 mL | INTRAMUSCULAR | Status: DC | PRN

## 2013-12-11 MED ORDER — OXYCODONE-ACETAMINOPHEN 5-325 MG PO TABS: 1.0000 | ORAL_TABLET | ORAL | Status: DC | PRN

## 2013-12-11 MED ORDER — TERBUTALINE SULFATE 1 MG/ML IJ SOLN: 0.2500 mg | Freq: Once | INTRAMUSCULAR | Status: AC | PRN

## 2013-12-11 MED ORDER — ZOLPIDEM TARTRATE 5 MG PO TABS: 5.0000 mg | ORAL_TABLET | Freq: Every evening | ORAL | Status: DC | PRN

## 2013-12-12 ENCOUNTER — Encounter (HOSPITAL_COMMUNITY): Payer: Self-pay

## 2013-12-12 ENCOUNTER — Encounter (HOSPITAL_COMMUNITY)
Admission: RE | Disposition: A | Discharge status: Home / Self Care | Payer: Self-pay | Source: Ambulatory Visit | Attending: Obstetrics and Gynecology

## 2013-12-12 ENCOUNTER — Encounter (HOSPITAL_COMMUNITY): Payer: 59 | Admitting: Anesthesiology

## 2013-12-12 ENCOUNTER — Inpatient Hospital Stay (HOSPITAL_COMMUNITY): Payer: 59 | Admitting: Anesthesiology

## 2013-12-12 DIAGNOSIS — Z98891 History of uterine scar from previous surgery: Secondary | ICD-10-CM

## 2013-12-12 LAB — ABO/RH: ABO/RH(D): O POS

## 2013-12-12 LAB — RPR

## 2013-12-12 SURGERY — Surgical Case
Anesthesia: Spinal

## 2013-12-12 MED ORDER — NALBUPHINE HCL 10 MG/ML IJ SOLN: 5.0000 mg | INTRAMUSCULAR | Status: DC | PRN

## 2013-12-12 MED ORDER — FENTANYL CITRATE 0.05 MG/ML IJ SOLN
INTRAMUSCULAR | Status: AC
  Filled 2013-12-12: qty 2

## 2013-12-12 MED ORDER — OXYTOCIN 10 UNIT/ML IJ SOLN
40.0000 [IU] | INTRAVENOUS | Status: DC | PRN
  Administered 2013-12-12: 40 [IU] via INTRAVENOUS

## 2013-12-12 MED ORDER — MORPHINE SULFATE (PF) 0.5 MG/ML IJ SOLN
INTRAMUSCULAR | Status: DC | PRN
  Administered 2013-12-12: .2 mg via INTRATHECAL

## 2013-12-12 MED ORDER — LACTATED RINGERS IV SOLN
INTRAVENOUS | Status: DC
  Administered 2013-12-12: 17:00:00 via INTRAVENOUS

## 2013-12-12 MED ORDER — ONDANSETRON HCL 4 MG/2ML IJ SOLN: 4.0000 mg | Freq: Three times a day (TID) | INTRAMUSCULAR | Status: DC | PRN

## 2013-12-12 MED ORDER — IBUPROFEN 600 MG PO TABS
600.0000 mg | ORAL_TABLET | Freq: Four times a day (QID) | ORAL | Status: DC | PRN
  Administered 2013-12-12: 600 mg via ORAL

## 2013-12-12 MED ORDER — MEPERIDINE HCL 25 MG/ML IJ SOLN
6.2500 mg | INTRAMUSCULAR | Status: DC | PRN
  Administered 2013-12-12: 6.25 mg via INTRAVENOUS

## 2013-12-12 MED ORDER — ONDANSETRON HCL 4 MG PO TABS: 4.0000 mg | ORAL_TABLET | ORAL | Status: DC | PRN

## 2013-12-12 MED ORDER — FENTANYL CITRATE 0.05 MG/ML IJ SOLN
25.0000 ug | INTRAMUSCULAR | Status: DC | PRN
  Administered 2013-12-12: 50 ug via INTRAVENOUS

## 2013-12-12 MED ORDER — SIMETHICONE 80 MG PO CHEW
80.0000 mg | CHEWABLE_TABLET | ORAL | Status: DC
  Administered 2013-12-12 – 2013-12-13 (×3): 80 mg via ORAL
  Filled 2013-12-12 (×2): qty 1

## 2013-12-12 MED ORDER — DIPHENHYDRAMINE HCL 25 MG PO CAPS: 25.0000 mg | ORAL_CAPSULE | Freq: Four times a day (QID) | ORAL | Status: DC | PRN

## 2013-12-12 MED ORDER — OXYCODONE-ACETAMINOPHEN 5-325 MG PO TABS: 2.0000 | ORAL_TABLET | ORAL | Status: DC | PRN

## 2013-12-12 MED ORDER — KETOROLAC TROMETHAMINE 30 MG/ML IJ SOLN: 30.0000 mg | Freq: Four times a day (QID) | INTRAMUSCULAR | Status: DC | PRN

## 2013-12-12 MED ORDER — FENTANYL CITRATE 0.05 MG/ML IJ SOLN
INTRAMUSCULAR | Status: DC | PRN
  Administered 2013-12-12: 12.5 ug via INTRATHECAL

## 2013-12-12 MED ORDER — LANOLIN HYDROUS EX OINT: 1.0000 "application " | TOPICAL_OINTMENT | CUTANEOUS | Status: DC | PRN

## 2013-12-12 MED ORDER — IBUPROFEN 600 MG PO TABS
600.0000 mg | ORAL_TABLET | Freq: Four times a day (QID) | ORAL | Status: DC
  Administered 2013-12-13 – 2013-12-14 (×7): 600 mg via ORAL
  Filled 2013-12-12 (×8): qty 1

## 2013-12-12 MED ORDER — DIBUCAINE 1 % RE OINT: 1.0000 "application " | TOPICAL_OINTMENT | RECTAL | Status: DC | PRN

## 2013-12-12 MED ORDER — MEPERIDINE HCL 25 MG/ML IJ SOLN
INTRAMUSCULAR | Status: AC
  Administered 2013-12-12: 6.25 mg via INTRAVENOUS
  Filled 2013-12-12: qty 1

## 2013-12-12 MED ORDER — LACTATED RINGERS IV SOLN
INTRAVENOUS | Status: DC | PRN
  Administered 2013-12-12: 12:00:00 via INTRAVENOUS

## 2013-12-12 MED ORDER — PHENYLEPHRINE 8 MG IN D5W 100 ML (0.08MG/ML) PREMIX OPTIME
INJECTION | INTRAVENOUS | Status: DC | PRN
Start: 2013-12-12 — End: 2013-12-12
  Administered 2013-12-12: 60 ug/min via INTRAVENOUS

## 2013-12-12 MED ORDER — ZOLPIDEM TARTRATE 5 MG PO TABS: 5.0000 mg | ORAL_TABLET | Freq: Every evening | ORAL | Status: DC | PRN

## 2013-12-12 MED ORDER — DIPHENHYDRAMINE HCL 25 MG PO CAPS: 25.0000 mg | ORAL_CAPSULE | ORAL | Status: DC | PRN

## 2013-12-12 MED ORDER — SODIUM CHLORIDE 0.9 % IJ SOLN: 3.0000 mL | INTRAMUSCULAR | Status: DC | PRN

## 2013-12-12 MED ORDER — PRENATAL MULTIVITAMIN CH
1.0000 | ORAL_TABLET | Freq: Every day | ORAL | Status: DC
  Administered 2013-12-13 – 2013-12-14 (×2): 1 via ORAL
  Filled 2013-12-12 (×2): qty 1

## 2013-12-12 MED ORDER — PHENYLEPHRINE HCL 10 MG/ML IJ SOLN
INTRAMUSCULAR | Status: AC
  Filled 2013-12-12: qty 1

## 2013-12-12 MED ORDER — DIPHENHYDRAMINE HCL 50 MG/ML IJ SOLN: 12.5000 mg | INTRAMUSCULAR | Status: DC | PRN

## 2013-12-12 MED ORDER — KETOROLAC TROMETHAMINE 30 MG/ML IJ SOLN
INTRAMUSCULAR | Status: AC
  Administered 2013-12-12: 30 mg via INTRAMUSCULAR
  Filled 2013-12-12: qty 1

## 2013-12-12 MED ORDER — NALOXONE HCL 0.4 MG/ML IJ SOLN: 0.4000 mg | INTRAMUSCULAR | Status: DC | PRN

## 2013-12-12 MED ORDER — SCOPOLAMINE 1 MG/3DAYS TD PT72: 1.0000 | MEDICATED_PATCH | Freq: Once | TRANSDERMAL | Status: DC

## 2013-12-12 MED ORDER — NALBUPHINE HCL 10 MG/ML IJ SOLN: 5.0000 mg | Freq: Once | INTRAMUSCULAR | Status: AC | PRN

## 2013-12-12 MED ORDER — MIDAZOLAM HCL 2 MG/2ML IJ SOLN: 0.5000 mg | Freq: Once | INTRAMUSCULAR | Status: DC | PRN

## 2013-12-12 MED ORDER — MORPHINE SULFATE 0.5 MG/ML IJ SOLN
INTRAMUSCULAR | Status: AC
  Filled 2013-12-12: qty 10

## 2013-12-12 MED ORDER — LACTATED RINGERS IV SOLN
INTRAVENOUS | Status: DC | PRN
  Administered 2013-12-12 (×2): via INTRAVENOUS

## 2013-12-12 MED ORDER — DEXAMETHASONE SODIUM PHOSPHATE 10 MG/ML IJ SOLN
INTRAMUSCULAR | Status: AC
  Filled 2013-12-12: qty 1

## 2013-12-12 MED ORDER — FENTANYL CITRATE 0.05 MG/ML IJ SOLN
INTRAMUSCULAR | Status: AC
  Administered 2013-12-12: 50 ug via INTRAVENOUS
  Filled 2013-12-12: qty 2

## 2013-12-12 MED ORDER — OXYCODONE-ACETAMINOPHEN 5-325 MG PO TABS
1.0000 | ORAL_TABLET | ORAL | Status: DC | PRN
Start: 2013-12-12 — End: 2013-12-14
  Administered 2013-12-13 – 2013-12-14 (×3): 1 via ORAL
  Filled 2013-12-12 (×3): qty 1

## 2013-12-12 MED ORDER — KETOROLAC TROMETHAMINE 30 MG/ML IJ SOLN
30.0000 mg | Freq: Four times a day (QID) | INTRAMUSCULAR | Status: DC | PRN
  Administered 2013-12-12: 30 mg via INTRAMUSCULAR

## 2013-12-12 MED ORDER — PROMETHAZINE HCL 25 MG/ML IJ SOLN: 6.2500 mg | INTRAMUSCULAR | Status: DC | PRN

## 2013-12-12 MED ORDER — ACETAMINOPHEN 500 MG PO TABS: 1000.0000 mg | ORAL_TABLET | Freq: Four times a day (QID) | ORAL | Status: AC

## 2013-12-12 MED ORDER — SENNOSIDES-DOCUSATE SODIUM 8.6-50 MG PO TABS
2.0000 | ORAL_TABLET | ORAL | Status: DC
  Administered 2013-12-13 (×2): 2 via ORAL
  Filled 2013-12-12 (×2): qty 2

## 2013-12-12 MED ORDER — SIMETHICONE 80 MG PO CHEW
80.0000 mg | CHEWABLE_TABLET | Freq: Three times a day (TID) | ORAL | Status: DC
Start: 2013-12-12 — End: 2013-12-14
  Administered 2013-12-13 – 2013-12-14 (×4): 80 mg via ORAL
  Filled 2013-12-12 (×5): qty 1

## 2013-12-12 MED ORDER — SCOPOLAMINE 1 MG/3DAYS TD PT72
MEDICATED_PATCH | TRANSDERMAL | Status: DC | PRN
  Administered 2013-12-12: 1 via TRANSDERMAL

## 2013-12-12 MED ORDER — DEXTROSE 5 % IV SOLN
1.0000 ug/kg/h | INTRAVENOUS | Status: DC | PRN
  Filled 2013-12-12: qty 2

## 2013-12-12 MED ORDER — MENTHOL 3 MG MT LOZG: 1.0000 | LOZENGE | OROMUCOSAL | Status: DC | PRN

## 2013-12-12 MED ORDER — OXYTOCIN 40 UNITS IN LACTATED RINGERS INFUSION - SIMPLE MED: 62.5000 mL/h | INTRAVENOUS | Status: AC

## 2013-12-12 MED ORDER — ATROPINE SULFATE 0.4 MG/ML IJ SOLN
INTRAMUSCULAR | Status: DC | PRN
Start: 2013-12-12 — End: 2013-12-12
  Administered 2013-12-12: 0.2 mg via INTRAVENOUS

## 2013-12-12 MED ORDER — BUPIVACAINE IN DEXTROSE 0.75-8.25 % IT SOLN
INTRATHECAL | Status: DC | PRN
  Administered 2013-12-12: 1.6 mL via INTRATHECAL

## 2013-12-12 MED ORDER — WITCH HAZEL-GLYCERIN EX PADS
1.0000 | MEDICATED_PAD | CUTANEOUS | Status: DC | PRN
Start: 2013-12-12 — End: 2013-12-14

## 2013-12-12 MED ORDER — ONDANSETRON HCL 4 MG/2ML IJ SOLN
4.0000 mg | INTRAMUSCULAR | Status: DC | PRN
Start: 2013-12-12 — End: 2013-12-14

## 2013-12-12 MED ORDER — OXYTOCIN 10 UNIT/ML IJ SOLN
INTRAMUSCULAR | Status: AC
  Filled 2013-12-12: qty 4

## 2013-12-12 MED ORDER — MEPERIDINE HCL 25 MG/ML IJ SOLN: 6.2500 mg | INTRAMUSCULAR | Status: DC | PRN

## 2013-12-12 MED ORDER — SIMETHICONE 80 MG PO CHEW: 80.0000 mg | CHEWABLE_TABLET | ORAL | Status: DC | PRN

## 2013-12-12 MED ORDER — CEFAZOLIN SODIUM-DEXTROSE 2-3 GM-% IV SOLR
INTRAVENOUS | Status: DC | PRN
  Administered 2013-12-12: 2 g via INTRAVENOUS

## 2013-12-12 MED ORDER — KETOROLAC TROMETHAMINE 30 MG/ML IJ SOLN: 15.0000 mg | Freq: Once | INTRAMUSCULAR | Status: DC | PRN

## 2013-12-12 MED ORDER — SCOPOLAMINE 1 MG/3DAYS TD PT72
MEDICATED_PATCH | TRANSDERMAL | Status: AC
  Filled 2013-12-12: qty 1

## 2013-12-12 MED ORDER — BUPIVACAINE HCL (PF) 0.25 % IJ SOLN
INTRAMUSCULAR | Status: AC
  Filled 2013-12-12: qty 30

## 2013-12-12 MED ORDER — ONDANSETRON HCL 4 MG/2ML IJ SOLN
INTRAMUSCULAR | Status: DC | PRN
  Administered 2013-12-12: 4 mg via INTRAVENOUS

## 2013-12-12 MED ORDER — TETANUS-DIPHTH-ACELL PERTUSSIS 5-2.5-18.5 LF-MCG/0.5 IM SUSP: 0.5000 mL | Freq: Once | INTRAMUSCULAR | Status: DC

## 2013-12-12 MED ORDER — ONDANSETRON HCL 4 MG/2ML IJ SOLN
INTRAMUSCULAR | Status: AC
  Filled 2013-12-12: qty 2

## 2013-12-12 SURGICAL SUPPLY — 31 items
BARRIER ADHS 3X4 INTERCEED (GAUZE/BANDAGES/DRESSINGS) IMPLANT
CLAMP CORD UMBIL (MISCELLANEOUS) IMPLANT
CLOTH BEACON ORANGE TIMEOUT ST (SAFETY) ×2 IMPLANT
CONTAINER PREFILL 10% NBF 15ML (MISCELLANEOUS) IMPLANT
DRAPE SHEET LG 3/4 BI-LAMINATE (DRAPES) IMPLANT
DRSG OPSITE POSTOP 4X10 (GAUZE/BANDAGES/DRESSINGS) ×2 IMPLANT
DURAPREP 26ML APPLICATOR (WOUND CARE) ×2 IMPLANT
ELECT REM PT RETURN 9FT ADLT (ELECTROSURGICAL) ×2
ELECTRODE REM PT RTRN 9FT ADLT (ELECTROSURGICAL) ×1 IMPLANT
EXTRACTOR VACUUM M CUP 4 TUBE (SUCTIONS) IMPLANT
GLOVE BIO SURGEON STRL SZ 6.5 (GLOVE) ×2 IMPLANT
GOWN STRL REUS W/TWL LRG LVL3 (GOWN DISPOSABLE) ×4 IMPLANT
KIT ABG SYR 3ML LUER SLIP (SYRINGE) IMPLANT
NEEDLE HYPO 22GX1.5 SAFETY (NEEDLE) IMPLANT
NEEDLE HYPO 25X5/8 SAFETYGLIDE (NEEDLE) ×2 IMPLANT
NS IRRIG 1000ML POUR BTL (IV SOLUTION) ×2 IMPLANT
PACK C SECTION WH (CUSTOM PROCEDURE TRAY) ×2 IMPLANT
PAD OB MATERNITY 4.3X12.25 (PERSONAL CARE ITEMS) ×2 IMPLANT
STAPLER VISISTAT 35W (STAPLE) IMPLANT
STRIP CLOSURE SKIN 1/2X4 (GAUZE/BANDAGES/DRESSINGS) ×2 IMPLANT
SUT CHROMIC 0 CTX 36 (SUTURE) ×4 IMPLANT
SUT PLAIN 0 NONE (SUTURE) IMPLANT
SUT PLAIN 2 0 XLH (SUTURE) IMPLANT
SUT PROLENE 4 0 KS NEEDLE (SUTURE) ×2 IMPLANT
SUT VIC AB 0 CT1 27 (SUTURE) ×3
SUT VIC AB 0 CT1 27XBRD ANBCTR (SUTURE) ×3 IMPLANT
SUT VIC AB 4-0 KS 27 (SUTURE) IMPLANT
SYR CONTROL 10ML LL (SYRINGE) IMPLANT
TOWEL OR 17X24 6PK STRL BLUE (TOWEL DISPOSABLE) ×2 IMPLANT
TRAY FOLEY CATH 14FR (SET/KITS/TRAYS/PACK) ×2 IMPLANT
WATER STERILE IRR 1000ML POUR (IV SOLUTION) ×2 IMPLANT

## 2013-12-12 NOTE — Op Note (Signed)
NAME:  Arn MedalJOHANNES, Janyia                 ACCOUNT NO.:  MEDICAL RECORD NO.:  [id number]  LOCATION:                                 FACILITY:  PHYSICIAN:  Emillia Weatherly L. Olita Takeshita, M.D.DATE OF BIRTH:  1985-03-10  DATE OF PROCEDURE:  12/12/2013 DATE OF DISCHARGE:                              OPERATIVE REPORT   PREOPERATIVE DIAGNOSES:  Intrauterine pregnancy at 41 weeks, failed induction, maternal request for induction and fetal decelerations.  POSTOPERATIVE DIAGNOSES:  Intrauterine pregnancy at 41 weeks, failed induction, maternal request for induction and fetal decelerations.  PROCEDURE:  Primary low-transverse cesarean section.  SURGEON:  Vonita Calloway L. Zadiel Leyh, MD  ANESTHESIA:  Spinal.  ESTIMATED BLOOD LOSS:  500 mL.  COMPLICATIONS:  None.  DRAINS:  Foley catheter.  DESCRIPTION OF PROCEDURE:  The patient was taken to the operating room. Her spinal was placed.  She was then prepped and draped in the usual sterile fashion.  Foley catheter was inserted.  A low-transverse incision was made, carried down to the fascia.  Fascia scored in the midline, extended laterally.  The rectus muscles were separated in the midline.  The peritoneum was entered bluntly.  The peritoneal incision was then stretched.  The lower uterine segment was identified.  The bladder flap was created sharply and then digitally.  The bladder blade was then readjusted.  A low-transverse incision was made in the uterus. Uterus was entered using the hemostat.  The bladder flap was created. Amniotic fluid was clear.  The baby was in OP position and was asynclitic.  I had to rotate the head gently and placed the vacuum, delivered the baby easily with 1 gentle pull of the vacuum.  The cord was clamped and cut.  The baby was handed to the awaiting neonatal team. The baby was a female infant, Apgars 8 at nine minutes and 9 at five minute.  The cord blood was obtained.  The placenta was manually removed and noted to be normal  intact with a three-vessel cord.  The uterus was exteriorized and cleared of all clots and debris.  The uterine incision was closed in 1 layer using 0 chromic in a running locked stitch. Hemostasis was excellent.  The uterus was returned to the abdomen. Irrigation was performed.  Hemostasis was again noted.  The peritoneum was closed using 0 Vicryl.  The fascia was closed using 0 Vicryl starting at each corner and meeting in the midline. After irrigation of the skin, the skin was closed with 4-0 Vicryl on a Keith needle.  Steri-Strips were applied.  Honeycomb dressing was applied.  All sponge, lap, and instrument counts were correct x2.  The patient went to recovery room in stable condition.     Fionna Merriott L. Vincente PoliGrewal, M.D.     Florestine AversMLG/MEDQ  D:  12/12/2013  T:  12/12/2013  Job:  [id number]

## 2013-12-12 NOTE — Transfer of Care (Signed)
Immediate Anesthesia Transfer of Care Note  Patient: Taylor Choi A Chappuis  Procedure(s) Performed: Procedure(s): CESAREAN SECTION (N/A)  Patient Location: PACU  Anesthesia Type:Spinal  Level of Consciousness: awake, alert  and oriented  Airway & Oxygen Therapy: Patient Spontanous Breathing  Post-op Assessment: Report given to PACU RN and Post -op Vital signs reviewed and stable  Post vital signs: Reviewed and stable  Complications: No apparent anesthesia complications

## 2013-12-12 NOTE — Anesthesia Procedure Notes (Signed)
Spinal  Patient location during procedure: OR Start time: 12/12/2013 11:05 AM End time: 12/12/2013 11:08 AM Reason for block: procedure for pain Staffing Anesthesiologist: Leilani AbleHATCHETT, Gurley Climer Performed by: anesthesiologist  Preanesthetic Checklist Completed: patient identified, surgical consent, pre-op evaluation, timeout performed, IV checked, risks and benefits discussed and monitors and equipment checked Spinal Block Patient position: sitting Prep: site prepped and draped and DuraPrep Patient monitoring: heart rate, cardiac monitor, continuous pulse ox and blood pressure Approach: midline Location: L3-4 Injection technique: single-shot Needle Needle type: Pencan  Needle gauge: 24 G Needle length: 9 cm Needle insertion depth: 5 cm Assessment Sensory level: T4

## 2013-12-12 NOTE — Anesthesia Postprocedure Evaluation (Signed)
  Anesthesia Post-op Note  Anesthesia Post Note  Patient: Taylor Choi  Procedure(s) Performed: Procedure(s) (LRB): CESAREAN SECTION (N/A)  Anesthesia type: Spinal  Patient location: Mother/Baby  Post pain: Pain level controlled  Post assessment: Post-op Vital signs reviewed  Last Vitals:  Filed Vitals:   12/12/13 1556  BP: 107/62  Pulse: 58  Temp: 37.2 C  Resp: 20    Post vital signs: Reviewed  Level of consciousness: awake  Complications: No apparent anesthesia complications

## 2013-12-12 NOTE — Anesthesia Postprocedure Evaluation (Signed)
  Anesthesia Post Note  Patient: Taylor Choi  Procedure(s) Performed: Procedure(s) (LRB): CESAREAN SECTION (N/A)  Anesthesia type: Spinal  Patient location: PACU  Post pain: Pain level controlled  Post assessment: Post-op Vital signs reviewed  Last Vitals:  Filed Vitals:   12/12/13 1245  BP: 110/63  Pulse: 55  Temp:   Resp: 19    Post vital signs: Reviewed  Level of consciousness: awake  Complications: No apparent anesthesia complications

## 2013-12-12 NOTE — Brief Op Note (Signed)
12/11/2013 - 12/12/2013  11:49 AM  PATIENT:  Rod MaeLindsay A Pierron  28 y.o. female  PRE-OPERATIVE DIAGNOSIS:  IUP at 41 weeks Failed Induction - maternal request for  C Section Fetal Decelerations  POST-OPERATIVE DIAGNOSIS:   Same  PROCEDURE:  Procedure(s): CESAREAN SECTION (N/A)  SURGEON:  Surgeon(s) and Role:    * Jeani HawkingMichelle L Yeira Gulden, MD - Primary  PHYSICIAN ASSISTANT:   ASSISTANTS: none   ANESTHESIA:   spinal  EBL:  Total I/O In: 2000 [I.V.:2000] Out: -   BLOOD ADMINISTERED:none  DRAINS: Urinary Catheter (Foley)   LOCAL MEDICATIONS USED:  NONE  SPECIMEN:  No Specimen  DISPOSITION OF SPECIMEN:  N/A  COUNTS:  YES  TOURNIQUET:  * No tourniquets in log *  DICTATION: .Other Dictation: Dictation Number [id number]  PLAN OF CARE: Admit to inpatient   PATIENT DISPOSITION:  PACU - hemodynamically stable.   Delay start of Pharmacological VTE agent (>24hrs) due to surgical blood loss or risk of bleeding: not applicable

## 2013-12-12 NOTE — Anesthesia Preprocedure Evaluation (Signed)
Anesthesia Evaluation  Patient identified by MRN, date of birth, ID band Patient awake    Reviewed: Allergy & Precautions, H&P , NPO status , Patient's Chart, lab work & pertinent test results  Airway Mallampati: II      Dental   Pulmonary  breath sounds clear to auscultation        Cardiovascular Exercise Tolerance: Good Rhythm:regular Rate:Normal     Neuro/Psych  Headaches,    GI/Hepatic   Endo/Other    Renal/GU      Musculoskeletal   Abdominal   Peds  Hematology   Anesthesia Other Findings   Reproductive/Obstetrics (+) Pregnancy                           Anesthesia Physical Anesthesia Plan  ASA: II  Anesthesia Plan: Spinal   Post-op Pain Management:    Induction:   Airway Management Planned:   Additional Equipment:   Intra-op Plan:   Post-operative Plan:   Informed Consent: I have reviewed the patients History and Physical, chart, labs and discussed the procedure including the risks, benefits and alternatives for the proposed anesthesia with the patient or authorized representative who has indicated his/her understanding and acceptance.     Plan Discussed with: Anesthesiologist, CRNA and Surgeon  Anesthesia Plan Comments:         Anesthesia Quick Evaluation

## 2013-12-12 NOTE — H&P (Signed)
Taylor Choi is a 28 y.o. G 1 P 0 at 41 weeks presented last night for induction. She received 1 dose of Cytotec and a 2nd cytotec was not given because of noted FHR abnormalities that have since resolved. I arrived at 0715 and the patient requested C Section because she does not want to have a failed induction.  I have counseled her twice this am in regards to her options. Ultrasound last week was 8 pound 14 oz. Maternal Medical History:  Fetal activity: Perceived fetal activity is normal.      OB History   Grav Para Term Preterm Abortions TAB SAB Ect Mult Living   1              Past Medical History  Diagnosis Date  . Migraines   . Vaginal Pap smear, abnormal    Past Surgical History  Procedure Laterality Date  . External ear surgery    . Colposcopy     Family History: family history includes Colon polyps in her father; Diabetes in her father and paternal grandmother; Diverticulitis in her cousin, maternal grandmother, and mother. There is no history of Colon cancer. Social History:  reports that she has never smoked. She has never used smokeless tobacco. She reports that she does not drink alcohol or use illicit drugs.   Prenatal Transfer Tool  Maternal Diabetes: No Genetic Screening: Normal Maternal Ultrasounds/Referrals: Normal Fetal Ultrasounds or other Referrals:  None Maternal Substance Abuse:  No Significant Maternal Medications:  None Significant Maternal Lab Results:  None Other Comments:  None  Review of Systems  All other systems reviewed and are negative.   Dilation: Fingertip Effacement (%): 80 Station: -2 Exam by:: Pattie Flaharty Blood pressure 128/72, pulse 68, temperature 98.2 F (36.8 C), temperature source Oral, resp. rate 18, height 5' 9.5" (1.765 m), weight 170 lb (77.111 kg), last menstrual period 02/28/2013. Maternal Exam:  Abdomen: Fetal presentation: vertex     Fetal Exam Fetal State Assessment: Category I - tracings are  normal.     Physical Exam  Nursing note and vitals reviewed. Constitutional: She appears well-developed.  HENT:  Head: Normocephalic.  Eyes: Pupils are equal, round, and reactive to light.  Neck: Normal range of motion.  Cardiovascular: Normal rate and regular rhythm.   Respiratory: Effort normal.  GI: Soft.   Cervix is unchanged from last night - 80% tight fingertip and -2 Prenatal labs: ABO, Rh: --/--/O POS, O POS (10/15 2010) Antibody: NEG (10/15 2010) Rubella: Immune (02/23 0000) RPR: NON REAC (10/15 2010)  HBsAg: Negative (02/23 0000)  HIV: Non-reactive (02/23 0000)  GBS: Negative (10/08 0000)   Assessment/Plan: IUP at 41 weeks Post dates Requests C Section C Section consented, risks reviewed.  Taylor Choi L 12/12/2013, 8:45 AM

## 2013-12-12 NOTE — Lactation Note (Signed)
This note was copied from the chart of Taylor Choi. Lactation Consultation Note Initial visit at 5 hours of age.  MBU RN reports some soreness with latch on.  Baby is latched but mom unsure of depth, instructed on unlatching baby.  Nipple is erect and round.  Hand expression demonstrated with colostrum easily flowing.  Assisted with cross cradle hold and breast compression to latch baby.  Baby has wide open mouth with flanged lips and rhythmic sucking.  Baby maintained active sucking for a few minutes and then pulled off.  Repositioned to STS on mom's chest baby asleep.  [facility] given and discussed.  Encouraged to feed with early cues on demand.  Early newborn behavior discussed. Discussed possible spoon feeding if baby continues to cluster feed and mom is tired.   Mom to call for assist as needed.     Patient Name: Taylor Arn MedalLindsay Nixon AVWUJ'WToday's Date: 12/12/2013 Reason for consult: Initial assessment   Maternal Data Has patient been taught Hand Expression?: Yes Does the patient have breastfeeding experience prior to this delivery?: No  Feeding Feeding Type: Breast Fed Length of feed: 5 min  LATCH Score/Interventions Latch: Grasps breast easily, tongue down, lips flanged, rhythmical sucking. Intervention(s): Teach feeding cues;Skin to skin Intervention(s): Assist with latch;Breast compression  Audible Swallowing: A few with stimulation Intervention(s): Skin to skin;Hand expression Intervention(s): Skin to skin  Type of Nipple: Everted at rest and after stimulation  Comfort (Breast/Nipple): Soft / non-tender     Hold (Positioning): Assistance needed to correctly position infant at breast and maintain latch. Intervention(s): Position options;Skin to skin;Support Pillows;Breastfeeding basics reviewed  LATCH Score: 8  Lactation Tools Discussed/Used     Consult Status Consult Status: Follow-up Date: 12/13/13 Follow-up type: In-patient    Shoptaw, Arvella MerlesJana  Lynn 12/12/2013, 4:59 PM

## 2013-12-12 NOTE — Progress Notes (Signed)
Dr Brayton CavesFreeman Jackson and Dr Vincente PoliGrewal consulted with pt regarding c-section. Pt understands and agrees to procedure.

## 2013-12-13 LAB — CBC
HEMATOCRIT: 32.8 % — AB (ref 36.0–46.0)
HEMOGLOBIN: 11.4 g/dL — AB (ref 12.0–15.0)
MCH: 32 pg (ref 26.0–34.0)
MCHC: 34.8 g/dL (ref 30.0–36.0)
MCV: 92.1 fL (ref 78.0–100.0)
Platelets: 178 10*3/uL (ref 150–400)
RBC: 3.56 MIL/uL — AB (ref 3.87–5.11)
RDW: 13.2 % (ref 11.5–15.5)
WBC: 13.5 10*3/uL — ABNORMAL HIGH (ref 4.0–10.5)

## 2013-12-13 LAB — BIRTH TISSUE RECOVERY COLLECTION (PLACENTA DONATION)

## 2013-12-13 NOTE — Plan of Care (Signed)
Problem: Discharge Progression Outcomes Goal: MMR given as ordered Outcome: Not Applicable Date Met:  65/03/54 Rubella immune

## 2013-12-13 NOTE — Lactation Note (Signed)
This note was copied from the chart of Boy Arn MedalLindsay Ostermann. Lactation Consultation Note  Patient Name: Boy Arn MedalLindsay Ciolino YNWGN'FToday's Date: 12/13/2013 Reason for consult: Follow-up assessment of this mom and baby at 31 hours postpartum.  RN, Johnny BridgeMartha had recently assisted mom with a deeper latch and flanged lips to prevent soreness and recommends comfort gelpads.  LC arrived to find mom holding baby and ready to order dinner, so LC provided comfort gelpads with instructions for use on nipples between feedings.  Baby asleep at this time.  LC encouraged continued cue feedings and for mom to call as needed for latch assistance.     Maternal Data    Feeding Feeding Type: Breast Fed Length of feed: 20 min  LATCH Score/Interventions Latch: Grasps breast easily, tongue down, lips flanged, rhythmical sucking. Intervention(s): Skin to skin;Teach feeding cues;Waking techniques Intervention(s): Adjust position;Assist with latch;Breast massage;Breast compression  Audible Swallowing: A few with stimulation Intervention(s): Skin to skin;Hand expression Intervention(s): Skin to skin;Hand expression  Type of Nipple: Everted at rest and after stimulation  Comfort (Breast/Nipple): Filling, red/small blisters or bruises, mild/mod discomfort  Problem noted: Mild/Moderate discomfort Interventions (Mild/moderate discomfort): Comfort gels  Hold (Positioning): Assistance needed to correctly position infant at breast and maintain latch. Intervention(s): Breastfeeding basics reviewed;Support Pillows;Position options;Skin to skin  LATCH Score: 7  (most recent LATCH assessment by RN, Johnny BridgeMartha)  Lactation Tools Discussed/Used   Comfort gelpads, nipple care  Consult Status Consult Status: Follow-up Date: 12/14/13 Follow-up type: In-patient    Warrick ParisianBryant, Delcie Ruppert [facility] 12/13/2013, 7:23 PM

## 2013-12-13 NOTE — Progress Notes (Signed)
Subjective: Postpartum Day 1: Cesarean Delivery Patient reports incisional pain, tolerating PO and no problems voiding.    Objective: Vital signs in last 24 hours: Temp:  [97.5 F (36.4 C)-99.2 F (37.3 C)] 97.6 F (36.4 C) (10/17 0500) Pulse Rate:  [51-81] 55 (10/17 0500) Resp:  [12-23] 18 (10/17 0500) BP: (105-149)/(53-132) 117/62 mmHg (10/17 0500) SpO2:  [97 %-100 %] 98 % (10/17 0500)  Physical Exam:  General: alert, cooperative and appears stated age Lochia: appropriate Uterine Fundus: firm Incision: healing well, no significant drainage, no dehiscence DVT Evaluation: No evidence of DVT seen on physical exam.   Recent Labs  12/11/13 2010 12/13/13 0613  HGB 12.3 11.4*  HCT 34.6* 32.8*    Assessment/Plan: Status post Cesarean section. Doing well postoperatively.  Continue current care. Patient desires circ for baby  - consented at bedside  Cadence Haslam L 12/13/2013, 7:41 AM

## 2013-12-14 MED ORDER — OXYCODONE-ACETAMINOPHEN 5-325 MG PO TABS: 1.0000 | ORAL_TABLET | ORAL | Status: DC | PRN

## 2013-12-14 MED ORDER — IBUPROFEN 600 MG PO TABS: 600.0000 mg | ORAL_TABLET | Freq: Four times a day (QID) | ORAL | Status: DC

## 2013-12-14 NOTE — Discharge Summary (Signed)
Obstetric Discharge Summary Reason for Admission: induction of labor Prenatal Procedures: none Intrapartum Procedures: cesarean: low cervical, transverse Postpartum Procedures: none Complications-Operative and Postpartum: none Hemoglobin  Date Value Ref Range Status  12/13/2013 11.4* 12.0 - 15.0 g/dL Final     HCT  Date Value Ref Range Status  12/13/2013 32.8* 36.0 - 46.0 % Final    Physical Exam:  General: alert, cooperative and appears stated age 69Lochia: appropriate Uterine Fundus: firm Incision: healing well, no significant drainage, no dehiscence, no significant erythema DVT Evaluation: No evidence of DVT seen on physical exam.  Discharge Diagnoses: Term Pregnancy-delivered  Discharge Information: Date: 12/14/2013 Activity: pelvic rest Diet: routine Medications: Ibuprofen and Percocet Condition: improved Instructions: refer to practice specific booklet Discharge to: home   Newborn Data: Live born female  Birth Weight: 8 lb 5 oz (3771 g) APGAR: 8, 9  Home with mother.  Taylor Choi L 12/14/2013, 8:49 AM

## 2013-12-14 NOTE — Progress Notes (Signed)
Subjective: Postpartum Day 2: Cesarean Delivery Patient reports tolerating PO, + flatus and no problems voiding.    Objective: Vital signs in last 24 hours: Temp:  [97.7 F (36.5 C)-98.4 F (36.9 C)] 98.2 F (36.8 C) (10/18 0540) Pulse Rate:  [54-78] 71 (10/18 0540) Resp:  [18-20] 18 (10/18 0540) BP: (115-125)/(58-79) 125/79 mmHg (10/18 0540) SpO2:  [100 %] 100 % (10/17 1001)  Physical Exam:  General: alert, cooperative and appears stated age Lochia: appropriate Uterine Fundus: firm Incision: healing well, no significant drainage, no dehiscence DVT Evaluation: No evidence of DVT seen on physical exam.   Recent Labs  12/11/13 2010 12/13/13 0613  HGB 12.3 11.4*  HCT 34.6* 32.8*    Assessment/Plan: Status post Cesarean section. Doing well postoperatively.  Discharge home with standard precautions and return to clinic in 1 to 2 weeks.  Falyn Rubel L 12/14/2013, 8:48 AM

## 2013-12-14 NOTE — Lactation Note (Addendum)
This note was copied from the chart of Taylor Arn MedalLindsay Hassinger. Lactation Consultation Note  Patient Name: Taylor Choi WUJWJ'XToday's Date: 12/14/2013 Reason for consult: Follow-up assessment Baby latched with depth , multiply swallows noted , increased with breast compressions . When Baby released nipple appeared normal shape. Per mom breast are full, and heavier , nipples are alittle tender , has not used comfort gels yet . LC reviewed  Comfort gels , and instructed on shells ( noted areola edema), due to milk coming in), LC reviewed sore nipple and engorgement prevention and tx. Mom has a hand pump , LC checked for flange size #24 a good fit. Per mom also has a DEBP Medela. Mother informed of post-discharge support and given phone number to the lactation department, including services for phone call assistance;  out-patient appointments; and breastfeeding support group. List of other breastfeeding resources in the community given in the handout. Encouraged  mother to call for problems or concerns related to breastfeeding.    Maternal Data    Feeding Feeding Type:  (baby latched , with swallows ) Length of feed:  (per mom )  LATCH Score/Interventions                Intervention(s): Breastfeeding basics reviewed     Lactation Tools Discussed/Used WIC Program: No Pump Review: Setup, frequency, and cleaning;Milk Storage Initiated by:: MAI  Date initiated:: 12/14/13   Consult Status Consult Status: Complete Date: 12/14/13    Kathrin Greathouseorio, Kaleah Hagemeister Ann 12/14/2013, 9:37 AM

## 2013-12-14 NOTE — Plan of Care (Signed)
Problem: Discharge Progression Outcomes Goal: Remove staples per MD order Outcome: Not Applicable Date Met:  00/29/84 Incision closed with sutures; no staples used

## 2013-12-15 ENCOUNTER — Encounter (HOSPITAL_COMMUNITY): Payer: Self-pay | Admitting: Obstetrics and Gynecology

## 2013-12-29 ENCOUNTER — Encounter (HOSPITAL_COMMUNITY): Payer: Self-pay | Admitting: Obstetrics and Gynecology

## 2014-05-20 ENCOUNTER — Ambulatory Visit: Payer: 59 | Admitting: Podiatry

## 2014-05-26 ENCOUNTER — Ambulatory Visit (INDEPENDENT_AMBULATORY_CARE_PROVIDER_SITE_OTHER): Payer: 59

## 2014-05-26 ENCOUNTER — Ambulatory Visit (INDEPENDENT_AMBULATORY_CARE_PROVIDER_SITE_OTHER): Payer: 59 | Admitting: Podiatry

## 2014-05-26 ENCOUNTER — Encounter: Payer: Self-pay | Admitting: Podiatry

## 2014-05-26 VITALS — BP 113/69 | HR 77 | Resp 12

## 2014-05-26 DIAGNOSIS — R52 Pain, unspecified: Secondary | ICD-10-CM

## 2014-05-26 DIAGNOSIS — M2010 Hallux valgus (acquired), unspecified foot: Secondary | ICD-10-CM

## 2014-05-26 NOTE — Patient Instructions (Signed)
Pre-Operative Instructions  Congratulations, you have decided to take an important step to improving your quality of life.  You can be assured that the doctors of [facility] will be with you every step of the way.  1. Plan to be at the surgery center/hospital at least 1 (one) hour prior to your scheduled time unless otherwise directed by the surgical center/hospital staff.  You must have a responsible adult accompany you, remain during the surgery and drive you home.  Make sure you have directions to the surgical center/hospital and know how to get there on time. 2. For hospital based surgery you will need to obtain a history and physical form from your family physician within 1 month prior to the date of surgery- we will give you a form for you primary physician.  3. We make every effort to accommodate the date you request for surgery.  There are however, times where surgery dates or times have to be moved.  We will contact you as soon as possible if a change in schedule is required.   4. No Aspirin/Ibuprofen for one week before surgery.  If you are on aspirin, any non-steroidal anti-inflammatory medications (Mobic, Aleve, Ibuprofen) you should stop taking it 7 days prior to your surgery.  You make take Tylenol  For pain prior to surgery.  5. Medications- If you are taking daily heart and blood pressure medications, seizure, reflux, allergy, asthma, anxiety, pain or diabetes medications, make sure the surgery center/hospital is aware before the day of surgery so they may notify you which medications to take or avoid the day of surgery. 6. No food or drink after midnight the night before surgery unless directed otherwise by surgical center/hospital staff. 7. No alcoholic beverages 24 hours prior to surgery.  No smoking 24 hours prior to or 24 hours after surgery. 8. Wear loose pants or shorts- loose enough to fit over bandages, boots, and casts. 9. No slip on shoes, sneakers are best. 10. Bring  your boot with you to the surgery center/hospital.  Also bring crutches or a walker if your physician has prescribed it for you.  If you do not have this equipment, it will be provided for you after surgery. 11. If you have not been contracted by the surgery center/hospital by the day before your surgery, call to confirm the date and time of your surgery. 12. Leave-time from work may vary depending on the type of surgery you have.  Appropriate arrangements should be made prior to surgery with your employer. 13. Prescriptions will be provided immediately following surgery by your doctor.  Have these filled as soon as possible after surgery and take the medication as directed. 14. Remove nail polish on the operative foot. 15. Wash the night before surgery.  The night before surgery wash the foot and leg well with the antibacterial soap provided and water paying special attention to beneath the toenails and in between the toes.  Rinse thoroughly with water and dry well with a towel.  Perform this wash unless told not to do so by your physician.  Enclosed: 1 Ice pack (please put in freezer the night before surgery)   1 Hibiclens skin cleaner   Pre-op Instructions  If you have any questions regarding the instructions, do not hesitate to call our office.  [place]: [address] [place], [place] [address] [phone]  [place]: [address] [place], [place] [address] [phone]  [place]: [address]  [place], [place] [address] [phone]  Dr. Richard   Tuchman DPM, Dr. Norman Regal DPM Dr. Richard Sikora DPM, Dr. M. Todd Hyatt DPM, Dr. Kathryn Egerton DPM 

## 2014-05-26 NOTE — Progress Notes (Signed)
   Subjective:    Patient ID: Taylor Choi, female    DOB: 08/02/1985, 29 y.o.   MRN: [id number]  HPI  PT STATED B/L BUNION BEEN PAINFUL FOR 2 YEARS. THE BUNION ARE GETTING WORSE AND BIGGER. FEET GET AGGRAVATED BY WEARING SHOES OR PRESSURE. TRIED NO TREATMENT.  Review of Systems  Neurological: Positive for headaches.  All other systems reviewed and are negative.      Objective:   Physical Exam        Assessment & Plan:

## 2014-05-27 NOTE — Progress Notes (Signed)
Subjective:     Patient ID: Taylor Choi, female   DOB: [birth year], 29 y.o.   MRN: [id number]  HPI patient presents with significant structural bunion deformity left and right which is gotten worse over the last 2 years. She has tried different treatment options without relief and has a significant family history of this problem. She has difficulty in wearing shoe gear at this time   Review of Systems  All other systems reviewed and are negative.      Objective:   Physical Exam  Constitutional: She is oriented to person, place, and time.  Cardiovascular: Intact distal pulses.   Musculoskeletal: Normal range of motion.  Neurological: She is oriented to person, place, and time.  Skin: Skin is warm.  Nursing note and vitals reviewed.  neurovascular status found to be intact with muscle strength adequate and range of motion subtalar midtarsal joint within normal limits. Patient's noted to have hyperostosis medial aspect first metatarsal head left over right with redness and pain when palpated. Patient is noted to have deviation of the big toe against the second toe left and right with moderate hammertoe deformities of the lesser toes left and right which are currently flexible in nature. 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 Reviewed the conditions and discussed conservative and surgical treatments for this particular problem. Patient is opted for surgical intervention and we will do 1 foot at a time the first being in the left and I have recommended Austin osteotomy with possible Akin osteotomy explaining we may not get complete bone correction. She had numerous questions which we answered and she'll reappoint one week to discuss in greater detail and will  schedule surgery at her convenience

## 2014-05-28 ENCOUNTER — Telehealth: Payer: Self-pay | Admitting: *Deleted

## 2014-05-28 NOTE — Telephone Encounter (Signed)
"  I was told by Dr. Charlsie Merlesegal to give you a call to schedule surgery.  I know he does surgery on Tuesday but he said he would make an exception for me and do it on a Thursday.  I just came off maternity leave so he'd work with me."  What day would you like to schedule it?  "I'd like to do it on 06/18/2014 but if I have to, I'll do it on 06/11/2014."  Okay, I will see if it's available.  If you don't hear from me that day is good.  If there's a problem, I'll call.  "You should know something by the time I come in on the fifth, right?"  I might know something by then.

## 2014-06-02 ENCOUNTER — Ambulatory Visit (INDEPENDENT_AMBULATORY_CARE_PROVIDER_SITE_OTHER): Payer: 59 | Admitting: Podiatry

## 2014-06-02 ENCOUNTER — Encounter: Payer: Self-pay | Admitting: Podiatry

## 2014-06-02 VITALS — BP 117/73 | HR 63 | Resp 12

## 2014-06-02 DIAGNOSIS — M2010 Hallux valgus (acquired), unspecified foot: Secondary | ICD-10-CM | POA: Diagnosis not present

## 2014-06-03 NOTE — Progress Notes (Signed)
Subjective:     Patient ID: Taylor Choi, female   DOB: [birth year], 29 y.o.   MRN: [id number]  HPI Asian states I'm ready to get my feet fixed and would like to get the left one done first   Review of Systems     Objective:   Physical Exam Neurovascular status intact digits are well-perfused and patient well oriented 3. Has large structural deformity first metatarsal bilateral with deviation of the big toe against the second toe bilateral with pain along the first metatarsal head and keratotic tissue along the hallux and first metatarsal.    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 Patient wants surgery and I have recommended aggressive Austin-type osteotomy along with possible Akin osteotomy left. I explained this will not give complete correction but should put her in a good functioning position teary at patient wants procedure and today I allowed her to read consent form reviewing with her all possible alternative treatments and complications listed. She is comfortable with this signs consent form after extensive review and understands total recovery. Take 6 months to one year with no long-term guarantees. Patient was given all preoperative instructions and also air fracture walker to get used to prior to procedure and is scheduled towards the middle this month for procedure

## 2014-06-10 ENCOUNTER — Telehealth: Payer: Self-pay | Admitting: *Deleted

## 2014-06-10 NOTE — Telephone Encounter (Signed)
New post-op appointment scheduled 05/05 @ 3:15pm.

## 2014-06-10 NOTE — Telephone Encounter (Signed)
I called patient to see if we could reschedule her surgery from 06/18/2014 because Dr. Charlsie Merlesegal is not available that day.  He said he can do it on 06/17/2014 or 06/25/2014.  "Okay, let's do it on that Wednesday.  That will probably be better."  I will reschedule it to 06/17/2014.  I called Aram [facility]Cynthia [facility] and rescheduled surgery to 06/17/2014.  "I'm sorry, I need to change that date to the 28th.  I realized I have too much to do on Wednesday."  That is okay, we can do it on the 28th.  I called the surgical center and rescheduled the surgery to 06/25/2014.

## 2014-06-10 NOTE — Telephone Encounter (Signed)
Will document reschedule date in [facility] surgery book. Thanks!

## 2014-06-15 ENCOUNTER — Telehealth: Payer: Self-pay | Admitting: *Deleted

## 2014-06-15 NOTE — Telephone Encounter (Signed)
Per Luster Landsbergenee from [facility], surgery room will not be available for surgery on 06/25/2014.  I called and informed Lillia AbedLindsay.  "I had asked for a Thursday because I just got off maternity leave.  I have been out a lot so I was trying to avoid missing several days.  I guess I can move it to Wednesday 06/24/2014.  I'll call back if there's a conflict."  I apologize about inconvenience.  "It's not your fault."

## 2014-06-22 ENCOUNTER — Telehealth: Payer: Self-pay | Admitting: *Deleted

## 2014-06-22 NOTE — Telephone Encounter (Signed)
Pt states she needs a medium short boot for surgery 06/24/2014 with Dr. Charlsie Merlesegal.  I informed pt she could pick the boot at the front desk.

## 2014-06-24 ENCOUNTER — Encounter: Payer: Self-pay | Admitting: Podiatry

## 2014-06-24 DIAGNOSIS — M2022 Hallux rigidus, left foot: Secondary | ICD-10-CM | POA: Diagnosis not present

## 2014-06-24 DIAGNOSIS — M2012 Hallux valgus (acquired), left foot: Secondary | ICD-10-CM | POA: Diagnosis not present

## 2014-06-25 ENCOUNTER — Telehealth: Payer: Self-pay | Admitting: *Deleted

## 2014-06-25 ENCOUNTER — Encounter: Payer: 59 | Admitting: Podiatry

## 2014-06-25 NOTE — Telephone Encounter (Signed)
Pt states she had surgery yesterday and now has on a open-ended sock, does that need to remain on at all times.  I told pt she could exchange for a large loose athletic sock, to keep the dressing in place and clean, I told the pt she would definitely need to remain in the surgical boot at all times.  Pt states understanding and also states she is doing well.

## 2014-07-02 ENCOUNTER — Ambulatory Visit (INDEPENDENT_AMBULATORY_CARE_PROVIDER_SITE_OTHER): Payer: 59

## 2014-07-02 ENCOUNTER — Ambulatory Visit (INDEPENDENT_AMBULATORY_CARE_PROVIDER_SITE_OTHER): Payer: 59 | Admitting: Podiatry

## 2014-07-02 ENCOUNTER — Encounter: Payer: Self-pay | Admitting: Podiatry

## 2014-07-02 VITALS — BP 111/70 | HR 76 | Resp 12

## 2014-07-02 DIAGNOSIS — Z9889 Other specified postprocedural states: Secondary | ICD-10-CM

## 2014-07-02 DIAGNOSIS — M2010 Hallux valgus (acquired), unspecified foot: Secondary | ICD-10-CM

## 2014-07-02 NOTE — Progress Notes (Signed)
Subjective:     Patient ID: Taylor Choi, female   DOB: [birth year], 29 y.o.   MRN: [id number]  HPI patient states I'm doing well with my foot and and able to walk with minimal swelling or pain   Review of Systems     Objective:   Physical Exam Neurovascular status intact muscle strength adequate with negative Homans sign noted. Wound edges are well coapted and hallux in rectus position and good alignment is noted of the first MPJ    Assessment:     Doing well post osteotomy left foot    Plan:     Reviewed x-rays and reapplied sterile dressing and continue immobilization compression and elevation. Reappoint 2 weeks to reevaluate

## 2014-07-07 NOTE — Progress Notes (Signed)
DOS 06/24/2014 Austin bunionectomy  with pin fixation left foot, Aiken ost with wire left foot

## 2014-07-16 ENCOUNTER — Ambulatory Visit (INDEPENDENT_AMBULATORY_CARE_PROVIDER_SITE_OTHER): Payer: 59 | Admitting: Podiatry

## 2014-07-16 ENCOUNTER — Encounter: Payer: Self-pay | Admitting: Podiatry

## 2014-07-16 ENCOUNTER — Ambulatory Visit (INDEPENDENT_AMBULATORY_CARE_PROVIDER_SITE_OTHER): Payer: 59

## 2014-07-16 DIAGNOSIS — Z9889 Other specified postprocedural states: Secondary | ICD-10-CM

## 2014-07-16 DIAGNOSIS — M2010 Hallux valgus (acquired), unspecified foot: Secondary | ICD-10-CM

## 2014-07-18 NOTE — Progress Notes (Signed)
She presents today 2 weeks status post Taylor Choi osteotomy left foot. She denies fever chills nausea vomiting muscle aches and pains. Continues to ambulate in her Darco shoe. She states that she's been washing the foot since the first week.  Objective: Vital signs are stable she is alert and oriented 3. She has good range of motion of the first metatarsophalangeal joint. The foot appears to be rectus and in good position. No signs of infection.  Assessment: Well-healing surgical foot.  Plan: She will follow-up with Dr. Charlsie Merlesegal in two weeks at which time she will more than likely start regular shoe gear. Radiographs will be performed at that time.

## 2014-07-30 ENCOUNTER — Ambulatory Visit (INDEPENDENT_AMBULATORY_CARE_PROVIDER_SITE_OTHER): Payer: 59

## 2014-07-30 ENCOUNTER — Encounter: Payer: Self-pay | Admitting: Podiatry

## 2014-07-30 ENCOUNTER — Ambulatory Visit (INDEPENDENT_AMBULATORY_CARE_PROVIDER_SITE_OTHER): Payer: 59 | Admitting: Podiatry

## 2014-07-30 VITALS — BP 107/68 | HR 75 | Resp 18

## 2014-07-30 DIAGNOSIS — Z9889 Other specified postprocedural states: Secondary | ICD-10-CM

## 2014-07-30 DIAGNOSIS — M2010 Hallux valgus (acquired), unspecified foot: Secondary | ICD-10-CM

## 2014-07-30 NOTE — Progress Notes (Signed)
Subjective:     Patient ID: Taylor Choi, female   DOB: Oct 06, 1985, 29 y.o.   MRN: [id number]  HPI patient states I'm very happy with my left foot and I like to get my right foot fixed in the next month if I can. Patient had foot surgery left which is done very well approximately 5 weeks ago   Review of Systems     Objective:   Physical Exam Neurovascular status intact muscle strength adequate range of motion within normal limits with wound edges well coapted and hallux in rectus position with good first MPJ range of motion with no crepitus left. Severe structural deformity right    Assessment:     Doing well post Austin osteotomy left Akin osteotomy left with significant structural damage of the right foot bunion wise and hallux wise.    Plan:     X-rays reviewed with patient and allow patient to gradually increase activity but continue surgical shoe usage with at least one more week or 2 and explained there is slight movement of bone but it should heal with no issues at its current position. Patient wants the right foot fixed but we'll wait until the second week of July and she is scheduled for surgery and will reappoint for consult concerning correction

## 2014-08-20 ENCOUNTER — Ambulatory Visit (INDEPENDENT_AMBULATORY_CARE_PROVIDER_SITE_OTHER): Payer: 59 | Admitting: Podiatry

## 2014-08-20 VITALS — BP 115/73 | HR 71 | Resp 12

## 2014-08-20 DIAGNOSIS — M2010 Hallux valgus (acquired), unspecified foot: Secondary | ICD-10-CM

## 2014-08-20 DIAGNOSIS — M205X2 Other deformities of toe(s) (acquired), left foot: Secondary | ICD-10-CM

## 2014-08-20 NOTE — Progress Notes (Signed)
   Subjective:    Patient ID: Taylor Choi, female    DOB: 1985-05-08, 29 y.o.   MRN: [id number]  HPI Patient is following up on operation on her L foot. She is also signing consent forms for her R bunionectomy.   Review of Systems     Objective:   Physical Exam        Assessment & Plan:

## 2014-08-22 NOTE — Progress Notes (Signed)
Subjective:     Patient ID: Taylor Choi, female   DOB: [birth year], 29 y.o.   MRN: [id number]  HPI patient states the left one is doing really well but I gets some pain on the outside of my foot at times and I'm ready to have my right bunion fixed   Review of Systems     Objective:   Physical Exam Vascular status intact muscle strength adequate with discomfort in the lateral side of the fifth MPJ left for probable change in gait and significant structural malalignment of the first metatarsal right and right hallux    Assessment:     Structural HAV deformity right along with structural hallux interphalangeus deformity right and well-healing surgical sites left with probable inflammation of the fifth MPJ secondary to compensation    Plan:     Reviewed all conditions and allowed her to read a consent form for correction of right going over all possible complications as outlined and everything from and alternative treatments standpoint. Patient wants surgery right and Eliberto Ivory with possible Akin is proposed with wire in pin fixation and she signs consent form and is given all preoperative instructions understanding total recovery. We'll take 6 months to one year begin ice on the left and may inject at the time of right foot surgery if inflammation persist

## 2014-08-28 ENCOUNTER — Encounter: Payer: Self-pay | Admitting: *Deleted

## 2014-08-28 NOTE — Progress Notes (Signed)
Patient's outpatient surgery scheduled for 09/09/2014 needed authorization.  Authorization number is [id number].

## 2014-09-08 ENCOUNTER — Telehealth: Payer: Self-pay | Admitting: *Deleted

## 2014-09-08 NOTE — Telephone Encounter (Signed)
"  Surgery tomorrow, I have a cough, sore throat and a low grade fever.  Does he still want to do surgery?"  I'm returning your call, Dr. Charlsie Merlesegal said you should be okay.  If you have a fever of 101F or more may want to reschedule.  "I haven't had a fever all weekend.  That's what I wanted to hear.  I'll be there for surgery tomorrow."

## 2014-09-09 ENCOUNTER — Encounter: Payer: Self-pay | Admitting: Podiatry

## 2014-09-09 DIAGNOSIS — M2011 Hallux valgus (acquired), right foot: Secondary | ICD-10-CM | POA: Diagnosis not present

## 2014-09-09 DIAGNOSIS — M2041 Other hammer toe(s) (acquired), right foot: Secondary | ICD-10-CM

## 2014-09-14 ENCOUNTER — Ambulatory Visit (INDEPENDENT_AMBULATORY_CARE_PROVIDER_SITE_OTHER): Payer: 59

## 2014-09-14 ENCOUNTER — Ambulatory Visit (INDEPENDENT_AMBULATORY_CARE_PROVIDER_SITE_OTHER): Payer: 59 | Admitting: Podiatry

## 2014-09-14 ENCOUNTER — Encounter: Payer: Self-pay | Admitting: Podiatry

## 2014-09-14 VITALS — BP 108/69 | HR 97 | Resp 16

## 2014-09-14 DIAGNOSIS — M2011 Hallux valgus (acquired), right foot: Secondary | ICD-10-CM

## 2014-09-14 DIAGNOSIS — Z9889 Other specified postprocedural states: Secondary | ICD-10-CM

## 2014-09-15 NOTE — Progress Notes (Signed)
Patient ID: Taylor Choi A [name], female   DOB: [birth year], 29 y.o.   MRN: [id number]  Subjective: 29 year old female presents the office today status post right foot bunionectomy with Akin osteotomy performed on 09/08/2014 with Dr. Charlsie Merlesegal. She states that overall she has to and well and she has not required any pain medication. The knees with a Cam Walker. She denies any systemic complaints as fevers, chills, nausea, vomiting. Denies any calf pain, chest pain concerns of breath. No other complaints this time in no acute changes since last appointment.  Objective: AAO 3, NAD DP/PT pulses palpable, CRT less than 3 seconds Incision is well coapted without any evidence of dehiscence and sutures are intact. There appears to be some slight gapping the incision distally however it does remain coapted. There is no surrounding erythema, ascending cellulitis, fluctuance, crepitus, drainage, malodor. There is no tenderness palpation along the surgical site no pain with first MTPJ range of motion. There is mild edema along the surgical site. No other areas of tenderness to bilateral lower extremities. No open lesions or pre-ulcerative lesions. No pain with calf compression, swelling, warmth, erythema.  Assessment: 29 year old female status post right foot bunion surgery, doing well  Plan: -X-rays were obtained and reviewed with the patient.  -Treatment options discussed including all alternatives, risks, and complications -Antibiotic ointment was placed over the incision followed by dry sterile dressing. Keep his dressing clean, dry, intact -Ice and elevation -Cam Walker -Follow-up 1 week for suture removal or sooner if any problems arise. In the meantime, encouraged to call the office with any questions, concerns, change in symptoms. Likely transition to surgical shoe next appointment.  Ovid CurdMatthew Jerrelle Michelsen, DPM

## 2014-09-17 NOTE — Progress Notes (Signed)
DOS 09/09/2014 Austin bunionectomy (cuting and moving bone) with pin fixation right foot, Aiken osteotomy with wire right.

## 2014-09-21 ENCOUNTER — Ambulatory Visit (INDEPENDENT_AMBULATORY_CARE_PROVIDER_SITE_OTHER): Payer: 59 | Admitting: Podiatry

## 2014-09-21 ENCOUNTER — Encounter: Payer: Self-pay | Admitting: Podiatry

## 2014-09-21 VITALS — BP 103/61 | HR 61 | Resp 12

## 2014-09-21 DIAGNOSIS — Z9889 Other specified postprocedural states: Secondary | ICD-10-CM

## 2014-09-21 DIAGNOSIS — M2011 Hallux valgus (acquired), right foot: Secondary | ICD-10-CM

## 2014-09-21 NOTE — Progress Notes (Signed)
Patient ID: Taylor Choi, female   DOB: 1985/12/09, 29 y.o.   MRN: [id number]  Subjective: 29 year old female presents the office today status post right foot bunionectomy with Akin osteotomy performed on 09/08/2014 with Dr. Charlsie Merles. She states that overall she has to and well and she has not required any pain medication. She takes ibuprofen as needed. She continues with a Cam Walker. She denies any systemic complaints as fevers, chills, nausea, vomiting. Denies any calf pain, chest pain concerns of breath. No other complaints this time in no acute changes since last appointment.  Objective: AAO 3, NAD DP/PT pulses palpable, CRT less than 3 seconds Incision is well coapted without any evidence of dehiscence and sutures are intact. There is no gapping of the incision at this time. There is no surrounding erythema, ascending cellulitis, fluctuance, crepitus, drainage, malodor. There is no tenderness palpation along the surgical site no pain with first MTPJ range of motion. There is mild edema along the surgical site. No other areas of tenderness to bilateral lower extremities. No open lesions or pre-ulcerative lesions. No pain with calf compression, swelling, warmth, erythema.  Assessment: 29 year old female status post right foot bunion surgery, doing well  Plan: -Treatment options discussed including all alternatives, risks, and complications -Suture ends were cut. Antibiotic ointment was placed over the incision followed by dry sterile dressing.  -She can shower in 24 hours as long as incision remains coapted. If there is any palms of the incision to hold off on showering call the office immediately. -Ice and elevation -Can transition to a Darco shoe. If there is any increase in pain to return to the CAM walker and call the office.  -Monitor for any clinical signs or symptoms of infection and directed to call the office immediately should any occur or go to the ER. -Follow-up in 2 weeks with  Dr. Charlsie Merles or sooner if any problems arise. In the meantime, encouraged to call the office with any questions, concerns, change in symptoms. Likely transition to surgical shoe next appointment.  Ovid Curd, DPM

## 2014-10-05 ENCOUNTER — Encounter: Payer: Self-pay | Admitting: Podiatry

## 2014-10-05 ENCOUNTER — Ambulatory Visit (INDEPENDENT_AMBULATORY_CARE_PROVIDER_SITE_OTHER): Payer: 59 | Admitting: Podiatry

## 2014-10-05 ENCOUNTER — Ambulatory Visit (INDEPENDENT_AMBULATORY_CARE_PROVIDER_SITE_OTHER): Payer: 59

## 2014-10-05 VITALS — BP 116/72 | HR 67 | Resp 15

## 2014-10-05 DIAGNOSIS — M2011 Hallux valgus (acquired), right foot: Secondary | ICD-10-CM

## 2014-10-06 NOTE — Progress Notes (Signed)
Subjective:     Patient ID: Taylor Choi, female   DOB: [birth year], 29 y.o.   MRN: [id number]  HPI patient states I'm doing well with my foot with good alignment noted and I'm able to wear shoe gear now with only mild swelling   Review of Systems     Objective:   Physical Exam Neurovascular status intact muscle strength was adequate with wound edges well coapted first metatarsal hallux with excellent range of motion of the joint 30 dorsiflexion 20 plantarflexion and hallux in rectus position with no indications of pathology    Assessment:     Doing well post forefoot surgery right    Plan:     X-rays evaluated and advised on continued range of motion exercises compression anti-inflammatory's and soft shoe usage. Reappoint to recheck 6 weeks or earlier if any issues should occur

## 2014-11-04 ENCOUNTER — Ambulatory Visit (INDEPENDENT_AMBULATORY_CARE_PROVIDER_SITE_OTHER): Payer: 59 | Admitting: Podiatry

## 2014-11-04 ENCOUNTER — Ambulatory Visit (INDEPENDENT_AMBULATORY_CARE_PROVIDER_SITE_OTHER): Payer: 59

## 2014-11-04 ENCOUNTER — Encounter: Payer: Self-pay | Admitting: Podiatry

## 2014-11-04 VITALS — BP 105/68 | HR 64 | Resp 16

## 2014-11-04 DIAGNOSIS — Z9889 Other specified postprocedural states: Secondary | ICD-10-CM

## 2014-11-04 DIAGNOSIS — M2011 Hallux valgus (acquired), right foot: Secondary | ICD-10-CM

## 2014-11-05 NOTE — Progress Notes (Signed)
Subjective:     Patient ID: Taylor Choi, female   DOB: [birth year], 29 y.o.   MRN: [id number]  HPI patient states I'm doing well with mild swelling in my right over left foot with good alignment noted and good range of motion. The pain in the outside of my left foot has improved but there is a small amount redness around the area   Review of Systems     Objective:   Physical Exam Neurovascular status intact muscle strength adequate with slight redness around the fifth MPJ left and patient noted to have good healing first metatarsal hallux bilateral with good positional component noted and wound edges that are well coapted and healed well    Assessment:     Doing well post osteotomy first metatarsal bilateral Akin osteotomy bilateral with inflammation lateral side left that's improved with slight redness noted    Plan:     (Cortisone usage for the outside of the left foot and reviewed final x-rays and patient is discharged and allowed to return to normal activity

## 2017-02-08 LAB — OB RESULTS CONSOLE ABO/RH: RH TYPE: POSITIVE

## 2017-02-08 LAB — OB RESULTS CONSOLE HEPATITIS B SURFACE ANTIGEN: Hepatitis B Surface Ag: NEGATIVE

## 2017-02-08 LAB — OB RESULTS CONSOLE GC/CHLAMYDIA
Chlamydia: NEGATIVE
GC PROBE AMP, GENITAL: NEGATIVE

## 2017-02-08 LAB — OB RESULTS CONSOLE RPR: RPR: NONREACTIVE

## 2017-02-08 LAB — OB RESULTS CONSOLE ANTIBODY SCREEN: Antibody Screen: NEGATIVE

## 2017-02-08 LAB — OB RESULTS CONSOLE RUBELLA ANTIBODY, IGM: Rubella: IMMUNE

## 2017-02-08 LAB — OB RESULTS CONSOLE HIV ANTIBODY (ROUTINE TESTING): HIV: NONREACTIVE

## 2017-08-24 ENCOUNTER — Encounter (HOSPITAL_COMMUNITY): Payer: Self-pay

## 2017-08-27 ENCOUNTER — Encounter (HOSPITAL_COMMUNITY): Payer: Self-pay

## 2017-09-06 NOTE — Patient Instructions (Addendum)
Rod MaeLindsay A Delellis  09/06/2017   Your procedure is scheduled on:  09/10/2017  Enter through the Main Entr[facility] of [facility] at 0530 AM.  Pick up the phone at the desk and dial [phone]  Call this number if you have problems the morning of surgery:[phone]  Remember:   Do not eat food:(After Midnight) Desps de medianoche.  Do not drink clear liquids: (After Midnight) Desps de medianoche.  Take these medicines the morning of surgery with A SIP OF WATER: may take zantac   Do not wear jewelry, make-up or nail polish.  Do not wear lotions, powders, or perfumes. Do not wear deodorant.  Do not shave 48 hours prior to surgery.  Do not bring valuables to the hospital.  [facility] is not   responsible for any belongings or valuables brought to the hospital.  Contacts, dentures or bridgework may not be worn into surgery.  Leave suitcase in the car. After surgery it may be brought to your room.  For patients admitted to the hospital, checkout time is 11:00 AM the day of              discharge.    N/A   Please read over the following fact sheets that you were given:   Surgical Site Infection Prevention

## 2017-09-07 ENCOUNTER — Encounter (HOSPITAL_COMMUNITY)
Admission: RE | Admit: 2017-09-07 | Discharge: 2017-09-07 | Disposition: A | Discharge status: Home / Self Care | Payer: 59 | Source: Ambulatory Visit | Attending: Obstetrics and Gynecology | Admitting: Obstetrics and Gynecology

## 2017-09-07 HISTORY — DX: Anxiety disorder, unspecified: F41.9

## 2017-09-07 LAB — CBC
HEMATOCRIT: 32.9 % — AB (ref 36.0–46.0)
HEMOGLOBIN: 11.3 g/dL — AB (ref 12.0–15.0)
MCH: 31.3 pg (ref 26.0–34.0)
MCHC: 34.3 g/dL (ref 30.0–36.0)
MCV: 91.1 fL (ref 78.0–100.0)
Platelets: 192 10*3/uL (ref 150–400)
RBC: 3.61 MIL/uL — AB (ref 3.87–5.11)
RDW: 13.6 % (ref 11.5–15.5)
WBC: 9 10*3/uL (ref 4.0–10.5)

## 2017-09-07 LAB — TYPE AND SCREEN
ABO/RH(D): O POS
Antibody Screen: NEGATIVE

## 2017-09-08 LAB — RPR: RPR Ser Ql: NONREACTIVE

## 2017-09-09 NOTE — Anesthesia Preprocedure Evaluation (Addendum)
Anesthesia Evaluation  Patient identified by MRN, date of birth, ID band Patient awake    Reviewed: Allergy & Precautions, H&P , NPO status , Patient's Chart, lab work & pertinent test results  Airway Mallampati: I       Dental  (+) Teeth Intact   Pulmonary    breath sounds clear to auscultation       Cardiovascular Exercise Tolerance: Good  Rhythm:regular Rate:Normal     Neuro/Psych  Headaches,    GI/Hepatic   Endo/Other    Renal/GU      Musculoskeletal   Abdominal   Peds  Hematology   Anesthesia Other Findings   Reproductive/Obstetrics (+) Pregnancy                            Anesthesia Physical  Anesthesia Plan  ASA: II  Anesthesia Plan: Spinal   Post-op Pain Management:    Induction:   PONV Risk Score and Plan: Treatment may vary due to age or medical condition, Ondansetron and Scopolamine patch - Pre-op  Airway Management Planned: Nasal Cannula and Natural Airway  Additional Equipment:   Intra-op Plan:   Post-operative Plan:   Informed Consent: I have reviewed the patients History and Physical, chart, labs and discussed the procedure including the risks, benefits and alternatives for the proposed anesthesia with the patient or authorized representative who has indicated his/her understanding and acceptance.     Plan Discussed with: Anesthesiologist, CRNA and Surgeon  Anesthesia Plan Comments:       Anesthesia Quick Evaluation

## 2017-09-09 NOTE — H&P (Signed)
Taylor Choi is a 32 y.o. female presenting for repeat cesarean section. Prenatal care complicated by anxiety disorder on Celexa. Also H/O migraine HA. OB History    Gravida  2   Para  1   Term  1   Preterm      AB      Living  1     SAB      TAB      Ectopic      Multiple      Live Births  1          Past Medical History:  Diagnosis Date  . Anxiety   . Migraines   . Vaginal Pap smear, abnormal    Past Surgical History:  Procedure Laterality Date  . BUNIONECTOMY    . CESAREAN SECTION N/A 12/12/2013   Procedure: CESAREAN SECTION;  Surgeon: Jeani HawkingMichelle L Grewal, MD;  Location: [facility];  Service: Obstetrics;  Laterality: N/A;  . COLPOSCOPY    . EXTERNAL EAR SURGERY     Family History: family history includes Colon polyps in her father; Diabetes in her father and paternal grandmother; Diverticulitis in her cousin, maternal grandmother, and mother. Social History:  reports that she has never smoked. She has never used smokeless tobacco. She reports that she does not drink alcohol or use drugs.     Maternal Diabetes: No Genetic Screening: Declined Maternal Ultrasounds/Referrals: Normal Fetal Ultrasounds or other Referrals:  None Maternal Substance Abuse:  No Significant Maternal Medications:  None Significant Maternal Lab Results:  None Other Comments:  None  ROS History   Last menstrual period 12/08/2016, unknown if currently breastfeeding. Maternal Exam:  Abdomen: Fetal presentation: vertex     Physical Exam  Cardiovascular: Normal rate and regular rhythm.  Respiratory: Effort normal.  GI: Soft.    Prenatal labs: ABO, Rh: --/--/O POS (07/12 1015) Antibody: NEG (07/12 1015) Rubella: Immune (12/13 0000) RPR: Non Reactive (07/12 1015)  HBsAg: Negative (12/13 0000)  HIV: Non-reactive (12/13 0000)  GBS:     Assessment/Plan: 32 yo G2P1 @ 39 3/7 weeks for repeat cesarean section   Taylor Choi 09/09/2017, 8:19 PM

## 2017-09-10 ENCOUNTER — Inpatient Hospital Stay (HOSPITAL_COMMUNITY): Payer: 59 | Admitting: Anesthesiology

## 2017-09-10 ENCOUNTER — Encounter (HOSPITAL_COMMUNITY): Payer: Self-pay

## 2017-09-10 ENCOUNTER — Inpatient Hospital Stay (HOSPITAL_COMMUNITY)
Admission: RE | Admit: 2017-09-10 | Discharge: 2017-09-12 | DRG: 788 | Disposition: A | Discharge status: Home / Self Care | Payer: 59 | Source: Ambulatory Visit | Attending: Obstetrics and Gynecology | Admitting: Obstetrics and Gynecology

## 2017-09-10 ENCOUNTER — Encounter (HOSPITAL_COMMUNITY)
Admission: RE | Disposition: A | Discharge status: Home / Self Care | Payer: Self-pay | Source: Ambulatory Visit | Attending: Obstetrics and Gynecology

## 2017-09-10 ENCOUNTER — Other Ambulatory Visit: Payer: Self-pay

## 2017-09-10 DIAGNOSIS — O99344 Other mental disorders complicating childbirth: Secondary | ICD-10-CM | POA: Diagnosis present

## 2017-09-10 DIAGNOSIS — F419 Anxiety disorder, unspecified: Secondary | ICD-10-CM | POA: Diagnosis present

## 2017-09-10 DIAGNOSIS — O34211 Maternal care for low transverse scar from previous cesarean delivery: Secondary | ICD-10-CM | POA: Diagnosis present

## 2017-09-10 DIAGNOSIS — Z98891 History of uterine scar from previous surgery: Secondary | ICD-10-CM

## 2017-09-10 DIAGNOSIS — Z3A39 39 weeks gestation of pregnancy: Secondary | ICD-10-CM

## 2017-09-10 DIAGNOSIS — Z79899 Other long term (current) drug therapy: Secondary | ICD-10-CM

## 2017-09-10 SURGERY — Surgical Case
Anesthesia: Spinal

## 2017-09-10 MED ORDER — SENNOSIDES-DOCUSATE SODIUM 8.6-50 MG PO TABS
2.0000 | ORAL_TABLET | ORAL | Status: DC
  Administered 2017-09-10 – 2017-09-11 (×2): 2 via ORAL
  Filled 2017-09-10 (×2): qty 2

## 2017-09-10 MED ORDER — ZOLPIDEM TARTRATE 5 MG PO TABS: 5.0000 mg | ORAL_TABLET | Freq: Every evening | ORAL | Status: DC | PRN

## 2017-09-10 MED ORDER — ACETAMINOPHEN 160 MG/5ML PO SOLN: 325.0000 mg | ORAL | Status: DC | PRN

## 2017-09-10 MED ORDER — DEXAMETHASONE SODIUM PHOSPHATE 4 MG/ML IJ SOLN
INTRAMUSCULAR | Status: DC | PRN
  Administered 2017-09-10: 4 mg via INTRAVENOUS

## 2017-09-10 MED ORDER — ACETAMINOPHEN 325 MG PO TABS
650.0000 mg | ORAL_TABLET | ORAL | Status: DC | PRN
  Administered 2017-09-10: 650 mg via ORAL
  Filled 2017-09-10: qty 2

## 2017-09-10 MED ORDER — DIBUCAINE 1 % RE OINT: 1.0000 "application " | TOPICAL_OINTMENT | RECTAL | Status: DC | PRN

## 2017-09-10 MED ORDER — WITCH HAZEL-GLYCERIN EX PADS: 1.0000 "application " | MEDICATED_PAD | CUTANEOUS | Status: DC | PRN

## 2017-09-10 MED ORDER — SIMETHICONE 80 MG PO CHEW
80.0000 mg | CHEWABLE_TABLET | Freq: Three times a day (TID) | ORAL | Status: DC
  Administered 2017-09-10 – 2017-09-12 (×5): 80 mg via ORAL
  Filled 2017-09-10 (×5): qty 1

## 2017-09-10 MED ORDER — ONDANSETRON HCL 4 MG/2ML IJ SOLN
INTRAMUSCULAR | Status: DC | PRN
  Administered 2017-09-10: 4 mg via INTRAVENOUS

## 2017-09-10 MED ORDER — PHENYLEPHRINE 8 MG IN D5W 100 ML (0.08MG/ML) PREMIX OPTIME
INJECTION | INTRAVENOUS | Status: DC | PRN
  Administered 2017-09-10: 60 ug/min via INTRAVENOUS
  Administered 2017-09-10: 80 ug/min via INTRAVENOUS

## 2017-09-10 MED ORDER — CEFAZOLIN SODIUM-DEXTROSE 2-4 GM/100ML-% IV SOLN
2.0000 g | INTRAVENOUS | Status: AC
  Administered 2017-09-10: 2 g via INTRAVENOUS
  Filled 2017-09-10: qty 100

## 2017-09-10 MED ORDER — OXYCODONE HCL 5 MG PO TABS: 10.0000 mg | ORAL_TABLET | ORAL | Status: DC | PRN

## 2017-09-10 MED ORDER — COCONUT OIL OIL: 1.0000 "application " | TOPICAL_OIL | Status: DC | PRN

## 2017-09-10 MED ORDER — CITALOPRAM HYDROBROMIDE 20 MG PO TABS
20.0000 mg | ORAL_TABLET | Freq: Every evening | ORAL | Status: DC
  Administered 2017-09-10 – 2017-09-11 (×2): 20 mg via ORAL
  Filled 2017-09-10 (×3): qty 1

## 2017-09-10 MED ORDER — MORPHINE SULFATE (PF) 0.5 MG/ML IJ SOLN
INTRAMUSCULAR | Status: AC
  Filled 2017-09-10: qty 10

## 2017-09-10 MED ORDER — OXYCODONE HCL 5 MG/5ML PO SOLN: 5.0000 mg | Freq: Once | ORAL | Status: DC | PRN

## 2017-09-10 MED ORDER — SOD CITRATE-CITRIC ACID 500-334 MG/5ML PO SOLN
30.0000 mL | ORAL | Status: DC
  Filled 2017-09-10: qty 15

## 2017-09-10 MED ORDER — OXYTOCIN 10 UNIT/ML IJ SOLN
INTRAMUSCULAR | Status: AC
  Filled 2017-09-10: qty 4

## 2017-09-10 MED ORDER — IBUPROFEN 600 MG PO TABS
600.0000 mg | ORAL_TABLET | Freq: Four times a day (QID) | ORAL | Status: DC
  Administered 2017-09-10 – 2017-09-12 (×8): 600 mg via ORAL
  Filled 2017-09-10 (×9): qty 1

## 2017-09-10 MED ORDER — FENTANYL CITRATE (PF) 100 MCG/2ML IJ SOLN
INTRAMUSCULAR | Status: DC | PRN
  Administered 2017-09-10: 10 ug via INTRATHECAL

## 2017-09-10 MED ORDER — ACETAMINOPHEN 325 MG PO TABS: 325.0000 mg | ORAL_TABLET | ORAL | Status: DC | PRN

## 2017-09-10 MED ORDER — ONDANSETRON HCL 4 MG/2ML IJ SOLN
INTRAMUSCULAR | Status: AC
  Filled 2017-09-10: qty 2

## 2017-09-10 MED ORDER — PRENATAL MULTIVITAMIN CH
1.0000 | ORAL_TABLET | Freq: Every day | ORAL | Status: DC
  Administered 2017-09-10 – 2017-09-12 (×3): 1 via ORAL
  Filled 2017-09-10 (×3): qty 1

## 2017-09-10 MED ORDER — LACTATED RINGERS IV SOLN
INTRAVENOUS | Status: DC
  Administered 2017-09-10: via INTRAVENOUS

## 2017-09-10 MED ORDER — TETANUS-DIPHTH-ACELL PERTUSSIS 5-2.5-18.5 LF-MCG/0.5 IM SUSP: 0.5000 mL | Freq: Once | INTRAMUSCULAR | Status: DC

## 2017-09-10 MED ORDER — DEXAMETHASONE SODIUM PHOSPHATE 4 MG/ML IJ SOLN
INTRAMUSCULAR | Status: AC
  Filled 2017-09-10: qty 1

## 2017-09-10 MED ORDER — PHENYLEPHRINE 8 MG IN D5W 100 ML (0.08MG/ML) PREMIX OPTIME
INJECTION | INTRAVENOUS | Status: AC
  Filled 2017-09-10: qty 100

## 2017-09-10 MED ORDER — DIPHENHYDRAMINE HCL 25 MG PO CAPS: 25.0000 mg | ORAL_CAPSULE | Freq: Four times a day (QID) | ORAL | Status: DC | PRN

## 2017-09-10 MED ORDER — MENTHOL 3 MG MT LOZG: 1.0000 | LOZENGE | OROMUCOSAL | Status: DC | PRN

## 2017-09-10 MED ORDER — BUPIVACAINE IN DEXTROSE 0.75-8.25 % IT SOLN
INTRATHECAL | Status: DC | PRN
  Administered 2017-09-10: 1.7 mL via INTRATHECAL
  Administered 2017-09-10: 1.8 mL via INTRATHECAL

## 2017-09-10 MED ORDER — FENTANYL CITRATE (PF) 100 MCG/2ML IJ SOLN
INTRAMUSCULAR | Status: AC
  Filled 2017-09-10: qty 2

## 2017-09-10 MED ORDER — SODIUM CHLORIDE 0.9 % IR SOLN
Status: DC | PRN
  Administered 2017-09-10: 1000 mL

## 2017-09-10 MED ORDER — OXYCODONE HCL 5 MG PO TABS: 5.0000 mg | ORAL_TABLET | Freq: Once | ORAL | Status: DC | PRN

## 2017-09-10 MED ORDER — LACTATED RINGERS IV SOLN
INTRAVENOUS | Status: DC
  Administered 2017-09-10 (×2): via INTRAVENOUS

## 2017-09-10 MED ORDER — ONDANSETRON HCL 4 MG/2ML IJ SOLN: 4.0000 mg | Freq: Once | INTRAMUSCULAR | Status: DC | PRN

## 2017-09-10 MED ORDER — STERILE WATER FOR IRRIGATION IR SOLN
Status: DC | PRN
  Administered 2017-09-10: 1000 mL

## 2017-09-10 MED ORDER — OXYTOCIN 10 UNIT/ML IJ SOLN
INTRAVENOUS | Status: DC | PRN
  Administered 2017-09-10: 40 [IU] via INTRAVENOUS

## 2017-09-10 MED ORDER — MEPERIDINE HCL 25 MG/ML IJ SOLN: 6.2500 mg | INTRAMUSCULAR | Status: DC | PRN

## 2017-09-10 MED ORDER — MORPHINE SULFATE (PF) 0.5 MG/ML IJ SOLN
INTRAMUSCULAR | Status: DC | PRN
  Administered 2017-09-10: 200 mg via INTRATHECAL

## 2017-09-10 MED ORDER — SIMETHICONE 80 MG PO CHEW: 80.0000 mg | CHEWABLE_TABLET | ORAL | Status: DC | PRN

## 2017-09-10 MED ORDER — SIMETHICONE 80 MG PO CHEW
80.0000 mg | CHEWABLE_TABLET | ORAL | Status: DC
  Administered 2017-09-10 – 2017-09-11 (×2): 80 mg via ORAL
  Filled 2017-09-10 (×2): qty 1

## 2017-09-10 MED ORDER — LACTATED RINGERS IV SOLN
INTRAVENOUS | Status: DC | PRN
  Administered 2017-09-10: 08:00:00 via INTRAVENOUS

## 2017-09-10 MED ORDER — FENTANYL CITRATE (PF) 100 MCG/2ML IJ SOLN: 25.0000 ug | INTRAMUSCULAR | Status: DC | PRN

## 2017-09-10 MED ORDER — OXYCODONE HCL 5 MG PO TABS
5.0000 mg | ORAL_TABLET | ORAL | Status: DC | PRN
  Administered 2017-09-10 – 2017-09-11 (×3): 5 mg via ORAL
  Filled 2017-09-10 (×3): qty 1

## 2017-09-10 MED ORDER — OXYTOCIN 40 UNITS IN LACTATED RINGERS INFUSION - SIMPLE MED
2.5000 [IU]/h | INTRAVENOUS | Status: AC
  Administered 2017-09-10: 2.5 [IU]/h via INTRAVENOUS

## 2017-09-10 SURGICAL SUPPLY — 34 items
BENZOIN TINCTURE PRP APPL 2/3 (GAUZE/BANDAGES/DRESSINGS) ×2 IMPLANT
CHLORAPREP W/TINT 26ML (MISCELLANEOUS) ×2 IMPLANT
CLAMP CORD UMBIL (MISCELLANEOUS) IMPLANT
CLOTH BEACON ORANGE TIMEOUT ST (SAFETY) ×2 IMPLANT
CLSR STERI-STRIP ANTIMIC 1/2X4 (GAUZE/BANDAGES/DRESSINGS) ×2 IMPLANT
DERMABOND ADVANCED (GAUZE/BANDAGES/DRESSINGS)
DERMABOND ADVANCED .7 DNX12 (GAUZE/BANDAGES/DRESSINGS) IMPLANT
DRSG OPSITE POSTOP 4X10 (GAUZE/BANDAGES/DRESSINGS) ×2 IMPLANT
ELECT REM PT RETURN 9FT ADLT (ELECTROSURGICAL) ×2
ELECTRODE REM PT RTRN 9FT ADLT (ELECTROSURGICAL) ×1 IMPLANT
EXTRACTOR VACUUM M CUP 4 TUBE (SUCTIONS) IMPLANT
GAUZE SPONGE 4X4 12PLY STRL LF (GAUZE/BANDAGES/DRESSINGS) ×2 IMPLANT
GLOVE BIO SURGEON STRL SZ7.5 (GLOVE) ×2 IMPLANT
GLOVE BIOGEL PI IND STRL 7.0 (GLOVE) ×1 IMPLANT
GLOVE BIOGEL PI INDICATOR 7.0 (GLOVE) ×1
GOWN STRL REUS W/TWL LRG LVL3 (GOWN DISPOSABLE) ×4 IMPLANT
KIT ABG SYR 3ML LUER SLIP (SYRINGE) ×2 IMPLANT
NEEDLE HYPO 25X5/8 SAFETYGLIDE (NEEDLE) ×2 IMPLANT
NS IRRIG 1000ML POUR BTL (IV SOLUTION) ×2 IMPLANT
PACK C SECTION WH (CUSTOM PROCEDURE TRAY) ×2 IMPLANT
PAD ABD 7.5X8 STRL (GAUZE/BANDAGES/DRESSINGS) ×2 IMPLANT
PAD OB MATERNITY 4.3X12.25 (PERSONAL CARE ITEMS) ×2 IMPLANT
PENCIL SMOKE EVAC W/HOLSTER (ELECTROSURGICAL) ×2 IMPLANT
STRIP CLOSURE SKIN 1/2X4 (GAUZE/BANDAGES/DRESSINGS) IMPLANT
SUT MNCRL 0 VIOLET CTX 36 (SUTURE) ×4 IMPLANT
SUT MONOCRYL 0 CTX 36 (SUTURE) ×4
SUT PDS AB 0 CTX 60 (SUTURE) ×2 IMPLANT
SUT PLAIN 0 NONE (SUTURE) IMPLANT
SUT PLAIN 2 0 (SUTURE)
SUT PLAIN 2 0 XLH (SUTURE) IMPLANT
SUT PLAIN ABS 2-0 CT1 27XMFL (SUTURE) IMPLANT
SUT VIC AB 4-0 KS 27 (SUTURE) ×2 IMPLANT
TOWEL OR 17X24 6PK STRL BLUE (TOWEL DISPOSABLE) ×2 IMPLANT
TRAY FOLEY W/BAG SLVR 14FR LF (SET/KITS/TRAYS/PACK) ×2 IMPLANT

## 2017-09-10 NOTE — Lactation Note (Signed)
This note was copied from a baby's chart. Lactation Consultation Note  Patient Name: Taylor Choi MedalLindsay Candella ONGEX'BToday's Date: 09/10/2017 Reason for consult: Initial assessment;Term  Visited with P2 Mom of 5 hr old baby born by repeat C/S.  Mom breastfed her first child for 3 months (3 yrs ago), without difficulty.    Mom encouraged to keep baby STS as much as possible.  She is feeling overheated right now, so family is holding baby.   Baby has latched 3 times already.    Reminded Mom of importance of breast massage and hand expression prior to latching.  Mom states she remembers how to do this.   Encouraged STS and cue based feedings with goal of >8 feedings per 24 hrs.    Lactation brochure given to Mom.  Mom aware of Lactation IP and OP services.    Encouraged to call prn for assistance.  Consult Status Consult Status: Follow-up Date: 09/11/17 Follow-up type: In-patient    Judee ClaraSmith, Angelize Ryce E 09/10/2017, 1:52 PM

## 2017-09-10 NOTE — Consult Note (Signed)
Neonatology Note:   Attendance at C-section:    I was asked by Dr. Tomblin to attend this repeat C/S at term. The mother is a G2P1 O pos, GBS not found with anxiety, on Celexa. ROM at delivery, fluid clear. Infant vigorous with good spontaneous cry and tone. Delayed cord clamping was done. Needed only minimal bulb suctioning. Ap 8/9. Lungs clear to ausc in DR. Pustular melanosis noted, right testicle in canal, left descended fully (discussed with parents). Infant is able to remain with his mother for skin to skin time under nursing supervision. Transferred to the care of Pediatrician.   Micala Saltsman C. Annisha Baar, MD   

## 2017-09-10 NOTE — Progress Notes (Signed)
   09/10/17 1720  Edinburgh Postnatal Depression Scale:  In the Past 7 Days  I have been able to laugh and see the funny side of things. 0  I have looked forward with enjoyment to things. 1  I have blamed myself unnecessarily when things went wrong. 2  I have been anxious or worried for no good reason. 2  I have felt scared or panicky for no good reason. 2  Things have been getting on top of me. 2  I have been so unhappy that I have had difficulty sleeping. 1  I have felt sad or miserable. 2  I have been so unhappy that I have been crying. 1  The thought of harming myself has occurred to me. 0  Edinburgh Postnatal Depression Scale Total 13  pt currently on celexa; ss consult in

## 2017-09-10 NOTE — Anesthesia Postprocedure Evaluation (Signed)
Anesthesia Post Note  Patient: Taylor Choi  Procedure(s) Performed: REPEAT CESAREAN SECTION (N/A )     Patient location during evaluation: Mother Baby Anesthesia Type: Spinal Level of consciousness: awake, awake and alert and oriented Pain management: pain level controlled Vital Signs Assessment: post-procedure vital signs reviewed and stable Respiratory status: spontaneous breathing, nonlabored ventilation and respiratory function stable Cardiovascular status: stable Postop Assessment: no headache, no backache, no apparent nausea or vomiting, adequate PO intake and patient able to bend at knees Anesthetic complications: no    Last Vitals:  Vitals:   09/10/17 1141 09/10/17 1307  BP: 131/70 114/71  Pulse: 74 74  Resp: 18 19  Temp: 37.2 C 36.6 C  SpO2: 100%     Last Pain:  Vitals:   09/10/17 1308  TempSrc:   PainSc: 2    Pain Goal:                 [name]

## 2017-09-10 NOTE — Transfer of Care (Signed)
Immediate Anesthesia Transfer of Care Note  Patient: Taylor Choi A Minichiello  Procedure(s) Performed: REPEAT CESAREAN SECTION (N/A )  Patient Location: PACU  Anesthesia Type:Spinal  Level of Consciousness: awake, alert  and patient cooperative  Airway & Oxygen Therapy: Patient Spontanous Breathing  Post-op Assessment: Report given to RN and Post -op Vital signs reviewed and stable  Post vital signs: Reviewed and stable  Last Vitals:  Vitals Value Taken Time  BP    Temp    Pulse    Resp    SpO2      Last Pain:  Vitals:   09/10/17 0557  TempSrc: Oral         Complications: No apparent anesthesia complications

## 2017-09-10 NOTE — Op Note (Signed)
Taylor Mae: Choi, Taylor A. MEDICAL RECORD [id number] ACCOUNT [id number] DATE OF BIRTH:[birth year] FACILITY: [facility] LOCATION: [facility] PHYSICIAN:Acelin Ferdig Jamal CollinE. Eva Vallee II, MD  OPERATIVE REPORT  DATE OF PROCEDURE:  09/10/2017  PREOPERATIVE DIAGNOSIS:  Desires repeat cesarean section.  POSTOPERATIVE DIAGNOSIS:  Desires repeat cesarean section.  SURGEON:  Harold HedgeJames Junelle Hashemi, II, MD  ASSISTANT:  Mamie Leversracy Tucker, RNFA.  ANESTHESIA:  Spinal, E.J. _____, M.D.  ESTIMATED BLOOD LOSS:  300 mL.  SPECIMENS:  None.  FINDINGS:  Viable female infant.  Apgars and birth weight pending.  INDICATIONS AND CONSENT:  This patient is a 32 year old G2, P1, who desires repeat cesarean section.  The potential risks and complications have been reviewed with the patient preoperatively including but not limited to infection, organ damage, bleeding  requiring transfusion of blood products with HIV and hepatitis acquisition, DVT, PE, pneumonia, wound breakdown.  All questions have been answered.  The patient states she understands and agrees and consent is signed on the chart.  DESCRIPTION OF PROCEDURE:  The patient was taken to the operating room where she was identified.  Spinal anesthetic was placed and she was placed in the dorsal supine position with a 15 degree left lateral wedge.  She was prepped vaginally with Betadine.   Foley catheter was placed and she was prepped abdominally with ChloraPrep.  After 3 minute drying time, she is draped in sterile fashion.  Timeout was undertaken.  After testing for adequate spinal anesthesia, skin was entered through the previous  Pfannenstiel incision and dissection was carried out in layers to the peritoneum.  The peritoneum was incised and extended superiorly and inferiorly.  Vesicouterine peritoneum was taken down cephalad and laterally.  Bladder flap developed.  The bladder  blade was placed.  Uterus was incised in a low transverse manner and the uterine cavity was entered bluntly  with a hemostat.  Clear fluid was noted.  The uterine incision was extended with the fingers cephalolaterally.  Vertex was then delivered and the  remainder of the baby was delivered without difficulty.  Good cry and tone was noted.  After 1 minute wait, the cord was clamped and cut and the baby was handed to the waiting pediatrics team.  Placenta was manually delivered.  Uterine cavity is clean.   Uterus is closed in 2 running locking imbricating layers of 0 Monocryl suture, which achieved good hemostasis.  Tubes and ovaries are normal.  Lavage was carried out.  All returned as clear.  Anterior peritoneum was closed in a running fashion with 0  Monocryl suture, which was also used to reapproximate the pyramidalis muscle in the midline.  The anterior rectus fascia was closed in a running fashion with a 0 looped PDS suture.  Subcutaneous layer was closed with interrupted plain suture and the skin  was closed in a subcuticular manner with a 4-0 Vicryl on a Keith needle.  Steri-Strips and honeycomb pressure dressing is applied.    All counts were correct.  The patient was taken to recovery room in stable condition.  AN/NUANCE  D:09/10/2017 T:09/10/2017 JOB:[id number]

## 2017-09-10 NOTE — Brief Op Note (Signed)
09/10/2017  8:22 AM  PATIENT:  Taylor Choi  32 y.o. female  PRE-OPERATIVE DIAGNOSIS:  repeat c-section x 1  POST-OPERATIVE DIAGNOSIS:  * No post-op diagnosis entered *  PROCEDURE:  Procedure(s) with comments: REPEAT CESAREAN SECTION (N/A) - Tracey RNFA  SURGEON:  Surgeon(s) and Role:    * Harold Hedgeomblin, Jyra Lagares, MD - Primary  PHYSICIAN ASSISTANT:   ASSISTANTS:  Pearson Forsterracie Tucker RNFA   ANESTHESIA:   spinal  EBL:  300 mL   BLOOD ADMINISTERED:none  DRAINS: Urinary Catheter (Foley)   LOCAL MEDICATIONS USED:  NONE  SPECIMEN:  No Specimen  DISPOSITION OF SPECIMEN:  N/A  COUNTS:  YES  TOURNIQUET:  * No tourniquets in log *  DICTATION: .Other Dictation: Dictation Number [id number]  PLAN OF CARE: Admit to inpatient   PATIENT DISPOSITION:  PACU - hemodynamically stable.   Delay start of Pharmacological VTE agent (>24hrs) due to surgical blood loss or risk of bleeding: not applicable

## 2017-09-10 NOTE — Progress Notes (Signed)
Reviewed with patient procedure-repeat cesarean section D/W risks including infection, organ damage, bleeding/transfusion-HIV/Hep, DVT/PE, pneumonia, scar separation.  All questions answered Patient states she understands and agrees

## 2017-09-10 NOTE — Anesthesia Postprocedure Evaluation (Signed)
Anesthesia Post Note  Patient: Rod MaeLindsay A Schwering  Procedure(s) Performed: REPEAT CESAREAN SECTION (N/A )     Patient location during evaluation: PACU Anesthesia Type: Spinal Level of consciousness: oriented and awake and alert Pain management: pain level controlled Vital Signs Assessment: post-procedure vital signs reviewed and stable Respiratory status: spontaneous breathing, respiratory function stable and patient connected to nasal cannula oxygen Cardiovascular status: blood pressure returned to baseline and stable Postop Assessment: no headache, no backache and no apparent nausea or vomiting Anesthetic complications: no    Last Vitals:  Vitals:   09/10/17 0855 09/10/17 0900  BP: 106/67 (!) 96/53  Pulse: 75 82  Resp: 16 15  Temp:    SpO2: 99% 100%    Last Pain:  Vitals:   09/10/17 0557  TempSrc: Oral   Pain Goal:                 Zephyr Sausedo

## 2017-09-10 NOTE — Anesthesia Procedure Notes (Signed)
Spinal  Patient location during procedure: OR Start time: 09/10/2017 7:30 AM End time: 09/10/2017 7:35 AM Staffing Anesthesiologist: Bethena Midgetddono, Danelly Hassinger, MD Preanesthetic Checklist Completed: patient identified, site marked, surgical consent, pre-op evaluation, timeout performed, IV checked, risks and benefits discussed and monitors and equipment checked Spinal Block Patient position: sitting Prep: DuraPrep Patient monitoring: heart rate, cardiac monitor, continuous pulse ox and blood pressure Approach: midline Location: L2-3 Injection technique: single-shot Needle Needle type: Sprotte  Needle gauge: 24 G Needle length: 9 cm Assessment Sensory level: T4 Additional Notes Repeat spinal with 1.8 ml  First at lower lever did not establish adequate lever.  No narcotics given with w second.

## 2017-09-11 ENCOUNTER — Encounter (HOSPITAL_COMMUNITY): Payer: Self-pay | Admitting: *Deleted

## 2017-09-11 LAB — CBC
HCT: 29.2 % — ABNORMAL LOW (ref 36.0–46.0)
HEMOGLOBIN: 10 g/dL — AB (ref 12.0–15.0)
MCH: 31.1 pg (ref 26.0–34.0)
MCHC: 34.2 g/dL (ref 30.0–36.0)
MCV: 90.7 fL (ref 78.0–100.0)
PLATELETS: 189 10*3/uL (ref 150–400)
RBC: 3.22 MIL/uL — AB (ref 3.87–5.11)
RDW: 13.7 % (ref 11.5–15.5)
WBC: 13.4 10*3/uL — ABNORMAL HIGH (ref 4.0–10.5)

## 2017-09-11 MED ORDER — DOCUSATE SODIUM 100 MG PO CAPS
100.0000 mg | ORAL_CAPSULE | Freq: Two times a day (BID) | ORAL | Status: DC
  Administered 2017-09-11 – 2017-09-12 (×3): 100 mg via ORAL
  Filled 2017-09-11 (×3): qty 1

## 2017-09-11 NOTE — Lactation Note (Signed)
This note was copied from a baby's chart. Lactation Consultation Note  Patient Name: Taylor Choi ZOXWR'UToday's Date: 09/11/2017 Reason for consult: Follow-up assessment   Baby 27 hours old and sleeping STS on father's chest.   Baby recently breastfed for 20 min. Encouraged hand expression before latching.  Mother states she has been taught how. Mom encouraged to feed baby 8-12 times/24 hours and with feeding cues.  Family denies questions or concerns. Mother's nipples are tender and she states she is using personal nipple cream.  Encouraged ebm.    Maternal Data    Feeding Feeding Type: Breast Fed Length of feed: 20 min  LATCH Score Latch: Grasps breast easily, tongue down, lips flanged, rhythmical sucking.  Audible Swallowing: A few with stimulation  Type of Nipple: Everted at rest and after stimulation  Comfort (Breast/Nipple): Soft / non-tender  Hold (Positioning): No assistance needed to correctly position infant at breast.  LATCH Score: 9  Interventions    Lactation Tools Discussed/Used     Consult Status Consult Status: Follow-up Date: 09/12/17 Follow-up type: In-patient    Dahlia ByesBerkelhammer, Taylor Choi 09/11/2017, 11:15 AM

## 2017-09-11 NOTE — Progress Notes (Signed)
Subjective: Postpartum Day 1: Cesarean Delivery Patient reports tolerating PO and + flatus.    Objective: Vital signs in last 24 hours: Temp:  [97.7 F (36.5 C)-99.2 F (37.3 C)] 98 F (36.7 C) (07/16 0748) Pulse Rate:  [51-83] 83 (07/16 0748) Resp:  [12-19] 18 (07/16 0748) BP: (106-131)/(61-72) 119/70 (07/16 0748) SpO2:  [97 %-100 %] 100 % (07/16 0519)  Physical Exam:  General: alert, cooperative, appears stated age and no distress Lochia: appropriate Uterine Fundus: firm Incision: healing well DVT Evaluation: No evidence of DVT seen on physical exam.  Recent Labs    09/11/17 0618  HGB 10.0*  HCT 29.2*    Assessment/Plan: Status post Cesarean section. Doing well postoperatively.  Continue current care.  Randell Detter C 09/11/2017, 9:05 AM

## 2017-09-12 MED ORDER — IBUPROFEN 600 MG PO TABS: 600.0000 mg | ORAL_TABLET | Freq: Four times a day (QID) | ORAL | 0 refills | Status: DC

## 2017-09-12 MED ORDER — OXYCODONE HCL 5 MG PO TABS: 5.0000 mg | ORAL_TABLET | ORAL | 0 refills | Status: DC | PRN

## 2017-09-12 NOTE — Discharge Summary (Signed)
Obstetric Discharge Summary Reason for Admission: cesarean section Prenatal Procedures: none Intrapartum Procedures: cesarean: low cervical, transverse Postpartum Procedures: none Complications-Operative and Postpartum: none Hemoglobin  Date Value Ref Range Status  09/11/2017 10.0 (L) 12.0 - 15.0 g/dL Final   HCT  Date Value Ref Range Status  09/11/2017 29.2 (L) 36.0 - 46.0 % Final    Physical Exam:  General: alert Lochia: appropriate Uterine Fundus: firm Incision: healing well DVT Evaluation: No evidence of DVT seen on physical exam.  Discharge Diagnoses: Term Pregnancy-delivered  Discharge Information: Date: 09/12/2017 Activity: pelvic rest Diet: routine Medications: PNV, Ibuprofen and Percocet Condition: stable Instructions: refer to practice specific booklet Discharge to: home Follow-up Information    [place], [facility] Schedule an appointment as soon as possible for a visit in 1 week(s).   Contact information: [address] [place] [address] [phone]           Newborn Data: Live born female  Birth Weight: 8 lb 4.1 oz (3745 g) APGAR: 8, 9  Newborn Delivery   Birth date/time:  09/10/2017 08:01:00 Delivery type:  C-Section, Low Transverse Trial of labor:  No C-section categorization:  Repeat     Home with mother.  Taylor Pippins Milana ObeyM Deborah Choi 09/12/2017, 7:58 AM

## 2017-09-12 NOTE — Progress Notes (Addendum)
CSW received consult due to score of 13 on the [place] Depression Screen.   Parents were pleasant and welcoming of CSW's visit.  FOB was holding baby, and appears involved and supportive, though did not verbally participate in the conversation.  Taylor Choi was easy to engage and seemed open to talking about how she is feeling.  Taylor Choi states that her c-section was planned and went well once she was anesthestized, which took two attempts.  She states she feels it has been harder to get up and move around this time than after her first, but that overall she thinks she is healing well and ready to go home.  Taylor Choi reports feeling scared about going home and somewhat overwhelmed about having two children at home now.  She reports having trouble with her emotions after her first child was born, and interested in talking with CSW about resources to feel better prepared this time if concerns arise.  CSW validated and normalized her feelings and encouraged her to allow herself to embrace her emotionality, while monitoring whether emotions are interfering with daily life at any point. CSW provided education regarding Baby Blues vs PMADs and provided Taylor Choi with information about support groups [facility].  CSW encouraged Taylor Choi to evaluate her mental health throughout the postpartum period with the use of the New Mom Checklist developed by Postpartum Progress, as well as the [place] Postnatal Depression Scale and notify a medical professional if symptoms arise.  CSW encouraged Taylor Choi to continue taking Celexa as she did in pregnancy.  She states she plans to see how she does for the first week or two and then consider increasing her dose from 20-30mg  if needed.  CSW also recommends finding a counselor, as this can be very beneficial to process feelings and increase coping mechanisms.  CSW provided resources.  Taylor Choi was very Adult nurseappreciative.  CSW identifies not barriers to discharge.

## 2017-09-12 NOTE — Lactation Note (Signed)
This note was copied from a baby's chart. Lactation Consultation Note: Mother breastfeeding when I arrived in her room. Infant in cradle hold. Observed frequent suckling and swallows.  Suggested that mother use breast compression when infant slows feeding down.  Mother reports that she is hearing swallows . She also reports that she is seeing colostrum.  Mother reports that her nipples are slightly tender and pink. Mother was advised to continue to hand express and use comfort gels.  Recommend to rotate feeding positions . Mother latched infant on in football . Assist with better support and bringing infant close tummy to tummy.  Mother receptive to all teaching.  Advised to continue to breastfeed infant 8-12 times in 24 hours and with all feeding cues.  Discussed cluster feeding.  Discussed treatment and prevention of engorgement.  Mother receptive to all teaching. She was informed of all available [facility] at [facility] and in the community.   Patient Name: Taylor Choi ZOXWR'UToday's Date: 09/12/2017 Reason for consult: Follow-up assessment   Maternal Data    Feeding Feeding Type: Breast Fed Length of feed: 30 min  LATCH Score Latch: Grasps breast easily, tongue down, lips flanged, rhythmical sucking.  Audible Swallowing: Spontaneous and intermittent  Type of Nipple: Everted at rest and after stimulation  Comfort (Breast/Nipple): Filling, red/small blisters or bruises, mild/mod discomfort  Hold (Positioning): No assistance needed to correctly position infant at breast.  LATCH Score: 9  Interventions Interventions: Skin to skin;Breast massage;Hand express;Breast compression;Adjust position;Support pillows;Position options;Expressed milk;Comfort gels;Hand pump  Lactation Tools Discussed/Used     Consult Status Consult Status: Complete    Michel BickersKendrick, Taylor Choi 09/12/2017, 11:35 AM

## 2018-06-19 ENCOUNTER — Telehealth: Payer: 59 | Admitting: Family

## 2018-06-19 DIAGNOSIS — Z20822 Contact with and (suspected) exposure to covid-19: Secondary | ICD-10-CM

## 2018-06-19 DIAGNOSIS — R6889 Other general symptoms and signs: Principal | ICD-10-CM

## 2018-06-19 MED ORDER — PROMETHAZINE-DM 6.25-15 MG/5ML PO SYRP: 5.0000 mL | ORAL_SOLUTION | Freq: Four times a day (QID) | ORAL | 0 refills | Status: DC | PRN

## 2018-06-19 NOTE — Progress Notes (Signed)
E-Vis.it f.ecovor Corona Virus Screening  Based on your current symptoms, you may very well have the virus, however your symptoms are mild. Currently, not all patients are being tested. If the symptoms are mild and there is not a known exposure, performing the test is not indicated.  Coronavirus disease 2019 (COVID-19) is a respiratory illness that can spread from person to person. The virus that causes COVID-19 is a new virus that was first identified in the country of [place] but is now found in multiple other countries and has spread to the [place].  Symptoms associated with the virus are mild to severe fever, cough, and shortness of breath. There is currently no vaccine to protect against COVID-19, and there is no specific antiviral treatment for the virus.   To be considered HIGH RISK for Coronavirus (COVID-19), you have to meet the following criteria:  . Traveled to [place], [place], [place], [place] or [place]; or in the [place] to [place], [place], [place], or [place]; and have fever, cough, and shortness of breath within the last 2 weeks of travel OR  . Been in close contact with a person diagnosed with COVID-19 within the last 2 weeks and have fever, cough, and shortness of breath  . IF YOU DO NOT MEET THESE CRITERIA, YOU ARE CONSIDERED LOW RISK FOR COVID-19.   It is vitally important that if you feel that you have an infection such as this virus or any other virus that you stay home and away from places where you may spread it to others.  You should self-quarantine for 14 days if you have symptoms that could potentially be coronavirus and avoid contact with people age 33 and older.   You can use medication such as A prescription cough medication called Phenergan DM 6.25 mg/15 mg. You make take one teaspoon / 5 ml every 4-6 hours as needed for cough  You may also take acetaminophen (Tylenol) as needed for fever.   Reduce your risk of any infection by using the same  precautions used for avoiding the common cold or flu:  Marland Kitchen Wash your hands often with soap and warm water for at least 20 seconds.  If soap and water are not readily available, use an alcohol-based hand sanitizer with at least 60% alcohol.  . If coughing or sneezing, cover your mouth and nose by coughing or sneezing into the elbow areas of your shirt or coat, into a tissue or into your sleeve (not your hands). . Avoid shaking hands with others and consider head nods or verbal greetings only. . Avoid touching your eyes, nose, or mouth with unwashed hands.  . Avoid close contact with people who are sick. . Avoid places or events with large numbers of people in one location, like concerts or sporting events. . Carefully consider travel plans you have or are making. . If you are planning any travel outside or inside the [place], visit the CDC's Travelers' Health webpage for the latest health notices. . If you have some symptoms but not all symptoms, continue to monitor at home and seek medical attention if your symptoms worsen. . If you are having a medical emergency, call 911.  HOME CARE . Only take medications as instructed by your medical team. . Drink plenty of fluids and get plenty of rest. . A steam or ultrasonic humidifier can help if you have congestion.   GET HELP RIGHT AWAY IF: . You develop worsening fever. . You become short of  breath . You cough up blood. . Your symptoms become more severe MAKE SURE YOU   Understand these instructions.  Will watch your condition.  Will get help right away if you are not doing well or get worse.  Your e-visit answers were reviewed by a board certified advanced clinical practitioner to complete your personal care plan.  Depending on the condition, your plan could have included both over the counter or prescription medications.  If there is a problem please reply once you have received a response from your provider. Your safety is important to us.  If  you have drug allergies check your prescription carefully.    You can use MyChart to ask questions about today's visit, request a non-urgent call back, or ask for a work or school excuse for 24 hours related to this e-Visit. If it has been greater than 24 hours you will need to follow up with your provider, or enter a new e-Visit to address those concerns. You will get an e-mail in the next two days asking about your experience.  I hope that your e-visit has been valuable and will speed your recovery. Thank you for using e-visits.   Greater than 5 minutes, yet less than 10 minutes of time have been spent researching, coordinating, and implementing care for this patient today.  Thank you for the details you included in the comment boxes. Those details are very helpful in determining the best course of treatment for you and help us to provide the best care.

## 2019-01-02 DIAGNOSIS — F419 Anxiety disorder, unspecified: Secondary | ICD-10-CM | POA: Insufficient documentation

## 2019-11-04 ENCOUNTER — Encounter: Payer: Self-pay | Admitting: Medical-Surgical

## 2019-11-04 ENCOUNTER — Ambulatory Visit (INDEPENDENT_AMBULATORY_CARE_PROVIDER_SITE_OTHER): Payer: 59 | Admitting: Medical-Surgical

## 2019-11-04 ENCOUNTER — Ambulatory Visit (INDEPENDENT_AMBULATORY_CARE_PROVIDER_SITE_OTHER): Payer: 59

## 2019-11-04 ENCOUNTER — Other Ambulatory Visit: Payer: Self-pay

## 2019-11-04 VITALS — BP 113/80 | HR 76 | Temp 98.7°F | Ht 67.32 in | Wt 167.8 lb

## 2019-11-04 DIAGNOSIS — Z7689 Persons encountering health services in other specified circumstances: Secondary | ICD-10-CM

## 2019-11-04 DIAGNOSIS — M542 Cervicalgia: Secondary | ICD-10-CM | POA: Diagnosis not present

## 2019-11-04 DIAGNOSIS — R079 Chest pain, unspecified: Secondary | ICD-10-CM | POA: Diagnosis not present

## 2019-11-04 DIAGNOSIS — R5383 Other fatigue: Secondary | ICD-10-CM | POA: Diagnosis not present

## 2019-11-04 DIAGNOSIS — F418 Other specified anxiety disorders: Secondary | ICD-10-CM

## 2019-11-04 DIAGNOSIS — IMO0002 Reserved for concepts with insufficient information to code with codable children: Secondary | ICD-10-CM

## 2019-11-04 DIAGNOSIS — G43709 Chronic migraine without aura, not intractable, without status migrainosus: Secondary | ICD-10-CM

## 2019-11-04 NOTE — Progress Notes (Signed)
New Patient Office Visit  Subjective:  Patient ID: Taylor Choi, female    DOB: December 17, 1985  Age: 34 y.o. MRN: [id number]  CC:  Chief Complaint  Patient presents with  . Establish Care    HPI [name] presents to establish care. No prior PCP. Has been seeing her OB/GYN for most of her preventative care.  Chest pain- has been experiencing dull left sided chest discomfort since she had COVID 19 in April of 2020 (suspects illness was COVID but was not tested for confirmation). She again had COVID in January 2021. She has since had her vaccines. The chest pain has been consistent, daily. At times it is a constant pain but other times she only notices it when she takes a deep breath. No radiation of the pain and no other symptoms including palpitations, shortness of breath, diaphoresis, or nausea. Exercises regularly and is able to tolerate cardio without difficulty.   Neck pain- reports several years of left neck/shoulder pain from the base of the neck across the top of the trapezius muscle. Had a steroid shot into the muscle several years ago which was very helpful. Has a massage once monthly and the masseuse always comments how her muscle is always very tight. Also has a neck roll that she uses to stretch her neck. Admits to having horrible posture and is working to remedy this. Feels that she wears her stress in her neck. Used to go to a chiropractor but they were unable to give her lasting relief. No recent imaging. Has tried a muscle relaxer but they tend to be too sedating. Takes Tylenol headache sparingly, no more than twice a week. Denies weakness of the LUE, numbness/tingling, or radiation of the pain into the shoulder/arm.  Migraines- seen by migraine clinic for management. Currently treated with Emgality but is looking to come off this soon.   Fatigue- feels exhausted all the time, gets frequent headaches, random moments of "blacking out" that self resolves within a couple  of minutes. Does not feel that anyone has ever truly evaluated her for the cause of her fatigue. Thinks she was told her iron was low before her child was born. Would like to have a workup to see if there is some cause of her fatigue that is fixable.  Women's health- sees OB/GYN  Depression/anxiety- see psychiatry. Currently taking Lexapro and notes she is feeling much better than she was. Unfortunately, she has developed a rash and thinks it is related to the medication. She is planning to stop the Lexapro soon because of this.  Past Medical History:  Diagnosis Date  . 2019 novel coronavirus disease (COVID-19) 05/2018  . Anxiety   . Depression   . Migraines   . Vaginal Pap smear, abnormal     Past Surgical History:  Procedure Laterality Date  . BUNIONECTOMY    . CESAREAN SECTION N/A 12/12/2013   Procedure: CESAREAN SECTION;  Surgeon: Cyril Mourning, MD;  Location: [facility];  Service: Obstetrics;  Laterality: N/A;  . CESAREAN SECTION N/A 09/10/2017   Procedure: REPEAT CESAREAN SECTION;  Surgeon: Everlene Farrier, MD;  Location: [facility];  Service: Obstetrics;  Laterality: N/A;  Tracey RNFA  . COLPOSCOPY    . EXTERNAL EAR SURGERY      Family History  Problem Relation Age of Onset  . Diverticulitis Mother   . Diverticulitis Maternal Grandmother   . Colon polyps Father   . Diabetes Father   . Diverticulitis Cousin   .  Diabetes Paternal Grandmother   . Colon cancer Neg Hx     Social History   Socioeconomic History  . Marital status: Married    Spouse name: Not on file  . Number of children: 2  . Years of education: Not on file  . Highest education level: Not on file  Occupational History  . Not on file  Tobacco Use  . Smoking status: Never Smoker  . Smokeless tobacco: Never Used  Vaping Use  . Vaping Use: Never used  Substance and Sexual Activity  . Alcohol use: Yes    Alcohol/week: 1.0 standard drink    Types: 1 Standard drinks or equivalent per week     Comment: rarely  . Drug use: No  . Sexual activity: Yes    Partners: Male    Birth control/protection: Condom  Other Topics Concern  . Not on file  Social History Narrative  . Not on file   Social Determinants of Health   Financial Resource Strain:   . Difficulty of Paying Living Expenses: Not on file  Food Insecurity:   . Worried About Charity fundraiser in the Last Year: Not on file  . Ran Out of Food in the Last Year: Not on file  Transportation Needs:   . Lack of Transportation (Medical): Not on file  . Lack of Transportation (Non-Medical): Not on file  Physical Activity:   . Days of Exercise per Week: Not on file  . Minutes of Exercise per Session: Not on file  Stress:   . Feeling of Stress : Not on file  Social Connections:   . Frequency of Communication with Friends and Family: Not on file  . Frequency of Social Gatherings with Friends and Family: Not on file  . Attends Religious Services: Not on file  . Active Member of Clubs or Organizations: Not on file  . Attends Archivist Meetings: Not on file  . Marital Status: Not on file  Intimate Partner Violence:   . Fear of Current or Ex-Partner: Not on file  . Emotionally Abused: Not on file  . Physically Abused: Not on file  . Sexually Abused: Not on file    ROS Review of Systems  Constitutional: Positive for fatigue. Negative for chills, diaphoresis, fever and unexpected weight change.  HENT: Negative for congestion.        Tympanostomy tubes bilaterally   Eyes: Negative for visual disturbance.  Respiratory: Negative for cough, chest tightness, shortness of breath and wheezing.   Cardiovascular: Positive for chest pain. Negative for palpitations and leg swelling.  Gastrointestinal: Positive for abdominal pain and constipation.  Endocrine: Negative for cold intolerance, heat intolerance, polydipsia, polyphagia and polyuria.  Genitourinary: Negative for dysuria, frequency, hematuria and urgency.   Musculoskeletal: Positive for arthralgias (bilateral knee pain) and back pain.  Skin: Positive for rash.  Neurological: Positive for dizziness and headaches. Negative for seizures, weakness and numbness.  Hematological: Does not bruise/bleed easily.  Psychiatric/Behavioral: Positive for dysphoric mood. Negative for self-injury, sleep disturbance and suicidal ideas. The patient is nervous/anxious.     Objective:   Today's Vitals: BP 113/80 (BP Location: Left Arm, Patient Position: Sitting, Cuff Size: Normal)   Pulse 76   Temp 98.7 F (37.1 C) (Oral)   Ht 5' 7.32" (1.71 m)   Wt 167 lb 12.8 oz (76.1 kg)   LMP 10/04/2019 (Exact Date)   PF 98 L/min   Breastfeeding No   BMI 26.03 kg/m   Physical Exam Vitals reviewed.  Constitutional:      General: She is not in acute distress.    Appearance: Normal appearance.  HENT:     Head: Normocephalic and atraumatic.  Neck:     Thyroid: No thyroid mass, thyromegaly or thyroid tenderness.  Cardiovascular:     Rate and Rhythm: Normal rate and regular rhythm.     Pulses: Normal pulses.     Heart sounds: Normal heart sounds. No murmur heard.  No friction rub. No gallop.   Pulmonary:     Effort: Pulmonary effort is normal. No respiratory distress.     Breath sounds: Normal breath sounds. No wheezing.  Musculoskeletal:     Cervical back: Normal range of motion and neck supple. No rigidity or tenderness.  Skin:    General: Skin is warm and dry.  Neurological:     Mental Status: She is alert and oriented to person, place, and time.  Psychiatric:        Mood and Affect: Mood normal.        Behavior: Behavior normal.        Thought Content: Thought content normal.        Judgment: Judgment normal.     Assessment & Plan:   1. Encounter to establish care Reviewed available information and discussed healthcare concerns with patient. Up to date on preventative care. Will seek women's health needs with her OB/GYN.   2. Fatigue, unspecified  type Checking labs as below. Discussed potential causes of fatigue. - CBC w/Diff/Platelet - COMPLETE METABOLIC PANEL WITH GFR - TSH - Hepatitis C Antibody - HIV Antibody (routine testing w rflx) - Sed Rate (ESR) - High sensitivity CRP - Fe+TIBC+Fer  3. Chest pain, unspecified type EKG- normal sinus rhythm with no acute changes. Normal axis and rate. Pain reproduceable on palpation of area seems to indicate a MSK etiology. Recommend trialing a few days of ibuprofen with heat/ice prn to see if this improves her discomfort.   4. Cervicalgia Since we do not have imaging on file, getting cervical spine x-rays today. Symptoms appear muscular in nature. May benefit some from the ibuprofen as above. Discussed trying a lower dose muscle relaxer or one that is less sedating. She would prefer to wait on that for now. Recommend following up with Dr. Darene Lamer for further evaluation. Likely will benefit more from a steroid injection. - DG Cervical Spine Complete; Future  5. Migraines Managed by the Migraine clinic.  6. Depression/anxiety Managed by Psychiatry.   Outpatient Encounter Medications as of 11/04/2019  Medication Sig  . cetirizine (ZYRTEC) 10 MG tablet Take 10 mg by mouth daily as needed for allergies.  Marland Kitchen escitalopram (LEXAPRO) 10 MG tablet Take 10 mg by mouth daily.  . ranitidine (ZANTAC) 75 MG tablet Take 75 mg by mouth 2 (two) times daily.  Marland Kitchen EMGALITY 120 MG/ML SOAJ SMARTSIG:1 Milliliter(s) SUB-Q  . [DISCONTINUED] citalopram (CELEXA) 20 MG tablet Take 20 mg by mouth every evening.  (Patient not taking: Reported on 11/04/2019)  . [DISCONTINUED] ibuprofen (ADVIL,MOTRIN) 600 MG tablet Take 1 tablet (600 mg total) by mouth every 6 (six) hours. (Patient not taking: Reported on 11/04/2019)  . [DISCONTINUED] oxyCODONE (OXY IR/ROXICODONE) 5 MG immediate release tablet Take 1 tablet (5 mg total) by mouth every 4 (four) hours as needed (pain scale 4-7). (Patient not taking: Reported on 11/04/2019)  .  [DISCONTINUED] Prenat w/o A-FE-Methfol-FA-DHA (PNV-DHA) 27-0.6-0.4-300 MG CAPS Take 1 capsule by mouth at bedtime. (Patient not taking: Reported on 11/04/2019)  . [DISCONTINUED] promethazine-dextromethorphan (PROMETHAZINE-DM)  6.25-15 MG/5ML syrup Take 5 mLs by mouth 4 (four) times daily as needed. (Patient not taking: Reported on 11/04/2019)   No facility-administered encounter medications on file as of 11/04/2019.    Follow-up: Return for neck pain follow up with Dr. Darene Lamer at your convenience. Further follow up pending lab results.  Clearnce Sorrel, DNP, APRN, FNP-BC [facility]

## 2019-11-04 NOTE — Progress Notes (Signed)
Patient states the following:  1. Chest pain since having COVID in January and April. States a dull discomfort.  2. She has been vaccinated for COVID.  3.  Left sided neck pain. Previously had a steroid injection for the pain. Her Migraine specialist advised her to be seen at PCP for the pain. Should not be connected to her migraines.  4. Increasing lethargy. Would like to have regular labs and hormones checked.  Patient is fasting today.   EKG completed.  Sent request for PAP results to [facility] along with signed med release form.

## 2019-11-05 LAB — COMPLETE METABOLIC PANEL WITH GFR
AG Ratio: 2.1 (calc) (ref 1.0–2.5)
ALT: 14 U/L (ref 6–29)
AST: 19 U/L (ref 10–30)
Albumin: 4.5 g/dL (ref 3.6–5.1)
Alkaline phosphatase (APISO): 62 U/L (ref 31–125)
BUN: 9 mg/dL (ref 7–25)
CO2: 24 mmol/L (ref 20–32)
Calcium: 9.3 mg/dL (ref 8.6–10.2)
Chloride: 104 mmol/L (ref 98–110)
Creat: 0.78 mg/dL (ref 0.50–1.10)
GFR, Est African American: 116 mL/min/{1.73_m2} (ref >=60)
GFR, Est Non African American: 100 mL/min/{1.73_m2} (ref >=60)
Globulin: 2.1 g/dL (calc) (ref 1.9–3.7)
Glucose, Bld: 87 mg/dL (ref 65–139)
Potassium: 4 mmol/L (ref 3.5–5.3)
Sodium: 136 mmol/L (ref 135–146)
Total Bilirubin: 0.5 mg/dL (ref 0.2–1.2)
Total Protein: 6.6 g/dL (ref 6.1–8.1)

## 2019-11-05 LAB — TSH: TSH: 1.32 mIU/L

## 2019-11-05 LAB — CBC WITH DIFFERENTIAL/PLATELET
Absolute Monocytes: 369 {cells}/uL (ref 200–950)
Basophils Absolute: 11 {cells}/uL (ref 0–200)
Basophils Relative: 0.2 %
Eosinophils Absolute: 11 {cells}/uL — ABNORMAL LOW (ref 15–500)
Eosinophils Relative: 0.2 %
HCT: 40.3 % (ref 35.0–45.0)
Hemoglobin: 13.7 g/dL (ref 11.7–15.5)
Lymphs Abs: 2085 {cells}/uL (ref 850–3900)
MCH: 30.2 pg (ref 27.0–33.0)
MCHC: 34 g/dL (ref 32.0–36.0)
MCV: 88.8 fL (ref 80.0–100.0)
MPV: 9.9 fL (ref 7.5–12.5)
Monocytes Relative: 6.7 %
Neutro Abs: 3025 {cells}/uL (ref 1500–7800)
Neutrophils Relative %: 55 %
Platelets: 286 Thousand/uL (ref 140–400)
RBC: 4.54 Million/uL (ref 3.80–5.10)
RDW: 12.3 % (ref 11.0–15.0)
Total Lymphocyte: 37.9 %
WBC: 5.5 Thousand/uL (ref 3.8–10.8)

## 2019-11-05 LAB — HEPATITIS C ANTIBODY
Hepatitis C Ab: NONREACTIVE
SIGNAL TO CUT-OFF: 0.01 (ref <1.00)

## 2019-11-05 LAB — HIV ANTIBODY (ROUTINE TESTING W REFLEX): HIV 1&2 Ab, 4th Generation: NONREACTIVE

## 2019-11-05 LAB — SEDIMENTATION RATE: Sed Rate: 2 mm/h (ref 0–20)

## 2019-11-05 LAB — IRON,TIBC AND FERRITIN PANEL
%SAT: 47 % (calc) — ABNORMAL HIGH (ref 16–45)
Ferritin: 16 ng/mL (ref 16–154)
Iron: 174 ug/dL (ref 40–190)
TIBC: 367 mcg/dL (calc) (ref 250–450)

## 2019-11-05 LAB — HIGH SENSITIVITY CRP: hs-CRP: 2 mg/L

## 2019-11-05 NOTE — Addendum Note (Signed)
Addended by: Ardyth Man on: 11/05/2019 09:20 AM   Modules accepted: Orders

## 2019-11-17 ENCOUNTER — Encounter: Payer: Self-pay | Admitting: Sports Medicine

## 2019-11-17 ENCOUNTER — Ambulatory Visit (INDEPENDENT_AMBULATORY_CARE_PROVIDER_SITE_OTHER): Payer: 59 | Admitting: Sports Medicine

## 2019-11-17 ENCOUNTER — Ambulatory Visit (INDEPENDENT_AMBULATORY_CARE_PROVIDER_SITE_OTHER): Payer: 59

## 2019-11-17 ENCOUNTER — Other Ambulatory Visit: Payer: Self-pay

## 2019-11-17 DIAGNOSIS — M222X2 Patellofemoral disorders, left knee: Secondary | ICD-10-CM

## 2019-11-17 DIAGNOSIS — M542 Cervicalgia: Secondary | ICD-10-CM

## 2019-11-17 NOTE — Assessment & Plan Note (Addendum)
This is a very pleasant 34 year old female, she has a history of migraine headaches, she also has history of paracervical spasm, she has had trigger point injections in the past that have provided tremendous relief of both her neck pain and her migraines. X-rays were done that were for the most part unremarkable. Because of the historical good response to trigger point injections we performed 3 trigger point injections into the left trapezius today. Adding cervical spine rehab exercises, return to see me in 6 weeks for this as needed. We will likely get an MRI if persistent discomfort.

## 2019-11-17 NOTE — Assessment & Plan Note (Signed)
Benign knee exam with typical patellofemoral symptoms. Adding x-rays, patellofemoral rehab, we discussed the mechanism. She will do McConnell taping. Return to see me in 6 weeks for this, we will consider formal PT versus injection if no better.

## 2019-11-17 NOTE — Progress Notes (Signed)
° ° °  Procedures performed today:    None.  Independent interpretation of notes and tests performed by another provider:   None.  Brief History, Exam, Impression, and Recommendations:    Taylor Choi is a pleasant 34yo female who presents today with left neck/shoulder pain that has been ongoing for years. She has tried the chiropractor and a masseuse with minimal improvement. She mainly feels tightness in the area of her trapezius. Trigger point injections in the past have helped relieve this pain. She also has a history of migraines. They were also relieved by trigger point injection in the past. We did 3 trigger point injectiions in her trapezius today. We will reeevaluate in 4-6 weeks.   She also has complained of left knee pain that has been going on for years. It is worse going up stairs or squatting. She had a normal knee exam. 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 We are going to give her at home rehab exercises and xrays today and see her back in 4-6 weeks for reevaluation.   Aurelio Jew, MS3   ___________________________________________ Ihor Austin. Benjamin Stain, M.D., ABFM., CAQSM. Primary Care and Sports Medicine  [facility]  Adjunct Instructor of Family Medicine  [employer]

## 2019-12-29 ENCOUNTER — Ambulatory Visit: Payer: 59 | Admitting: Sports Medicine

## 2020-05-19 LAB — HM PAP SMEAR: HM Pap smear: NORMAL

## 2020-09-30 ENCOUNTER — Telehealth: Payer: Self-pay | Admitting: Medical-Surgical

## 2020-09-30 NOTE — Telephone Encounter (Signed)
Patient called in with chest pain, spoke to clinical lead amber and was told to tell patient to go to hospital. Patient said ok.

## 2020-10-01 ENCOUNTER — Telehealth: Payer: Self-pay | Admitting: General Practice

## 2020-10-01 NOTE — Telephone Encounter (Signed)
Transition Care Management Unsuccessful Follow-up Telephone Call  Date of discharge and from where:  09/30/20 from [facility]  Attempts:  1st Attempt  Reason for unsuccessful TCM follow-up call:  Left voice message    

## 2020-10-04 NOTE — Telephone Encounter (Signed)
Transition Care Management Unsuccessful Follow-up Telephone Call  Date of discharge and from where:  09/30/20 from [facility]  Attempts:  2nd Attempt  Reason for unsuccessful TCM follow-up call:  Left voice message Patient does have follow up scheduled with Hyman Hopes, NP on 10/08/20.

## 2020-10-06 NOTE — Telephone Encounter (Signed)
Transition Care Management Unsuccessful Follow-up Telephone Call  Date of discharge and from where:  09/30/20 from [facility]  Attempts:  3rd Attempt  Reason for unsuccessful TCM follow-up call:  Left voice message    

## 2020-10-08 ENCOUNTER — Encounter: Payer: Self-pay | Admitting: Family Medicine

## 2020-10-08 ENCOUNTER — Ambulatory Visit (INDEPENDENT_AMBULATORY_CARE_PROVIDER_SITE_OTHER): Payer: 59 | Admitting: Family Medicine

## 2020-10-08 ENCOUNTER — Other Ambulatory Visit: Payer: Self-pay

## 2020-10-08 VITALS — BP 114/63 | HR 78 | Ht 67.0 in | Wt 178.0 lb

## 2020-10-08 DIAGNOSIS — T781XXA Other adverse food reactions, not elsewhere classified, initial encounter: Secondary | ICD-10-CM

## 2020-10-08 DIAGNOSIS — G4489 Other headache syndrome: Secondary | ICD-10-CM

## 2020-10-08 DIAGNOSIS — R5383 Other fatigue: Secondary | ICD-10-CM

## 2020-10-08 DIAGNOSIS — R079 Chest pain, unspecified: Secondary | ICD-10-CM | POA: Diagnosis not present

## 2020-10-08 NOTE — Progress Notes (Signed)
Acute Office Visit  Subjective:    Patient ID: Taylor Choi, female    DOB: 09-18-1985, 35 y.o.   MRN: [id number]  Chief Complaint  Patient presents with   Hospitalization Follow-up    HPI Patient is in today for hospital follow-up.  [name] was in the ED on 09/30/20 with chest pain that had started a few days prior and was described as intermittent pressure on her left chest that did not radiate. She had some relief with Tylenol. No other symptoms. CXR was negative. EKG NSR. CBC/CMP unremarkable. Troponin unremarkable. Pain improved and was thought gastric component was more likely cause. She was discharged on omeprazole.  Today she reports her chest pain is much better compared to last week.  She still has occasional tightness over her left chest however no pain with deep inspiration and no worsening with palpation.  States she took the omeprazole for a few days but has since forgotten to take it.  She does not think it made much difference anyways.  She was feeling so much better she thought about canceling this appointment, however she did want to discuss her ongoing fatigue.  States she has been feeling well overall for the past year or so.  She has been following with a headache clinic for migraines and has been struggling to find a good regimen.  She is also interested in delving into potential triggers.  She knows for sure that blue cheese is is a trigger.  She is also reporting some GI symptoms including bloating and diarrhea after certain meals.  She has not been keeping a food diary but thinks that she should as she does think she can be sensitive to certain foods.  She is interested in getting some food sensitivity testing done to see if we can find some more specific triggers to her GI upset and headaches.  She also feels like her Emgality tends to wear off towards the end of the month, which she thinks may increase her symptoms of feeling bad overall and having more frequent  headaches.  She is going to talk to her headache doctor about this. She has had success with trigger point injections in the past and is planning to schedule another appointment.  In regards to the fatigue she reports this could be related to changing her depression/anxiety.  She thought the Lexapro she was on was keeping her from sleeping, however when she changed to Effexor 2 months ago she has not noticed much difference in the fatigue.  States she does have trouble falling asleep at night.  She thinks her mind does start racing and anxiety could be a factor.  When she falls asleep she sleeps well but has trouble waking up with her alarm because she is so exhausted.  She is following with a psychiatrist for her Effexor.     Past Medical History:  Diagnosis Date   2019 novel coronavirus disease (COVID-19) 05/2018   Anxiety    Depression    Migraines    Vaginal Pap smear, abnormal     Past Surgical History:  Procedure Laterality Date   BUNIONECTOMY     CESAREAN SECTION N/A 12/12/2013   Procedure: CESAREAN SECTION;  Surgeon: Jeani Hawking, MD;  Location: [facility];  Service: Obstetrics;  Laterality: N/A;   CESAREAN SECTION N/A 09/10/2017   Procedure: REPEAT CESAREAN SECTION;  Surgeon: Harold Hedge, MD;  Location: [facility];  Service: Obstetrics;  Laterality: N/A;  Tracey RNFA   COLPOSCOPY  EXTERNAL EAR SURGERY      Family History  Problem Relation Age of Onset   Diverticulitis Mother    Diverticulitis Maternal Grandmother    Colon polyps Father    Diabetes Father    Diverticulitis Cousin    Diabetes Paternal Grandmother    Colon cancer Neg Hx     Social History   Socioeconomic History   Marital status: Married    Spouse name: Not on file   Number of children: 2   Years of education: Not on file   Highest education level: Not on file  Occupational History   Not on file  Tobacco Use   Smoking status: Never   Smokeless tobacco: Never  Vaping Use   Vaping  Use: Never used  Substance and Sexual Activity   Alcohol use: Yes    Alcohol/week: 1.0 standard drink    Types: 1 Standard drinks or equivalent per week    Comment: rarely   Drug use: No   Sexual activity: Yes    Partners: Male    Birth control/protection: Condom  Other Topics Concern   Not on file  Social History Narrative   Not on file   Social Determinants of Health   Financial Resource Strain: Not on file  Food Insecurity: Not on file  Transportation Needs: Not on file  Physical Activity: Not on file  Stress: Not on file  Social Connections: Not on file  Intimate Partner Violence: Not on file    Outpatient Medications Prior to Visit  Medication Sig Dispense Refill   EMGALITY 120 MG/ML SOAJ SMARTSIG:1 Milliliter(s) SUB-Q     levocetirizine (XYZAL) 5 MG tablet Take 5 mg by mouth every evening.  1   venlafaxine XR (EFFEXOR-XR) 37.5 MG 24 hr capsule Take 2 capsules by mouth every morning.  1   cetirizine (ZYRTEC) 10 MG tablet Take 10 mg by mouth daily as needed for allergies.     escitalopram (LEXAPRO) 10 MG tablet Take 10 mg by mouth daily.     ranitidine (ZANTAC) 75 MG tablet Take 75 mg by mouth 2 (two) times daily.     No facility-administered medications prior to visit.    Allergies  Allergen Reactions   Other Itching and Swelling    Sports adhesive spray    Wellbutrin [Bupropion] Itching    Review of Systems All review of systems negative except what is listed in the HPI     Objective:    Physical Exam Vitals reviewed.  Constitutional:      Appearance: Normal appearance.  HENT:     Head: Normocephalic and atraumatic.  Cardiovascular:     Rate and Rhythm: Normal rate and regular rhythm.     Pulses: Normal pulses.     Heart sounds: Normal heart sounds.  Pulmonary:     Effort: Pulmonary effort is normal.     Breath sounds: Normal breath sounds.  Musculoskeletal:     Cervical back: Normal range of motion and neck supple.  Skin:    General: Skin is  warm and dry.  Neurological:     General: No focal deficit present.     Mental Status: She is alert and oriented to person, place, and time.  Psychiatric:        Mood and Affect: Mood normal.        Behavior: Behavior normal.        Thought Content: Thought content normal.        Judgment: Judgment normal.  BP 114/63   Pulse 78   Ht 5\' 7"  (1.702 m)   Wt 178 lb (80.7 kg)   SpO2 100%   BMI 27.88 kg/m  Wt Readings from Last 3 Encounters:  10/08/20 178 lb (80.7 kg)  11/04/19 167 lb 12.8 oz (76.1 kg)  09/10/17 186 lb 4.8 oz (84.5 kg)    Health Maintenance Due  Topic Date Due   PAP SMEAR-Modifier  Never done   COVID-19 Vaccine (3 - Booster for Moderna series) 11/26/2019   INFLUENZA VACCINE  09/27/2020    There are no preventive care reminders to display for this patient.   Lab Results  Component Value Date   TSH 1.32 11/04/2019   Lab Results  Component Value Date   WBC 5.5 11/04/2019   HGB 13.7 11/04/2019   HCT 40.3 11/04/2019   MCV 88.8 11/04/2019   PLT 286 11/04/2019   Lab Results  Component Value Date   NA 136 11/04/2019   K 4.0 11/04/2019   CO2 24 11/04/2019   GLUCOSE 87 11/04/2019   BUN 9 11/04/2019   CREATININE 0.78 11/04/2019   BILITOT 0.5 11/04/2019   ALKPHOS 55 01/15/2013   AST 19 11/04/2019   ALT 14 11/04/2019   PROT 6.6 11/04/2019   ALBUMIN 4.5 01/15/2013   CALCIUM 9.3 11/04/2019   GFR 118.17 01/15/2013   No results found for: CHOL No results found for: HDL No results found for: LDLCALC No results found for: TRIG No results found for: CHOLHDL No results found for: 01/17/2013     Assessment & Plan:   1. Fatigue, unspecified type Ongoing fatigue.  She was not anemic at the ED, but we will go ahead and check an iron panel and B12 levels.  We will also check thyroid as it has been a while since this has been checked.  That has been normal in the past. - TSH - Iron, TIBC and Ferritin Panel - B12  2. 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  We discussed possible allergy testing and she would like to go this route.  Placing referral to the allergy clinic.  Encouraged her to keep a food diary in the meantime as this will be valuable information to them. - Ambulatory referral to Allergy  4. Chest pain, unspecified type Chest discomfort has significantly resolved since ED visit.  Patient is not worried about this anymore.  She does not think it is reflux related.  There could be an anxiety component.  5. Anxiety For her anxiety/depression I recommend continuing with psych as scheduled.  For the increased anxiety triggering insomnia, we discussed a potential role for hydroxyzine.  Encouraged her to speak to her psychiatrist about this at the next appointment.  Also discussed bedtime hygiene/habits.  States overall her anxiety and depression is much better than where she was a year ago.  Patient aware of signs/symptoms requiring further/urgent evaluation.  Follow-up with PCP in about 3 months.   Time: 35 minutes, >50% spent counseling/or care coordination regarding headaches, chest pain, anxiety/depression, food sensitivities including history, assessment, shared decision making, and ordering labs and referrals.    [id number] Lollie Marrow, DNP, FNP-C

## 2020-10-09 LAB — IRON,TIBC AND FERRITIN PANEL
%SAT: 22 % (calc) (ref 16–45)
Ferritin: 16 ng/mL (ref 16–154)
Iron: 71 ug/dL (ref 40–190)
TIBC: 328 mcg/dL (calc) (ref 250–450)

## 2020-10-09 LAB — TSH: TSH: 1.35 mIU/L

## 2020-10-09 LAB — VITAMIN B12: Vitamin B-12: 274 pg/mL (ref 200–1100)

## 2020-10-11 NOTE — Progress Notes (Signed)
MyChart message sent: Thyroid, iron, and B12 levels are all normal

## 2020-11-11 ENCOUNTER — Ambulatory Visit (INDEPENDENT_AMBULATORY_CARE_PROVIDER_SITE_OTHER): Payer: 59 | Admitting: Internal Medicine

## 2020-11-11 ENCOUNTER — Other Ambulatory Visit: Payer: Self-pay

## 2020-11-11 ENCOUNTER — Encounter: Payer: Self-pay | Admitting: Internal Medicine

## 2020-11-11 VITALS — BP 116/80 | HR 88 | Temp 98.4°F | Resp 18 | Ht 68.25 in | Wt 177.0 lb

## 2020-11-11 DIAGNOSIS — K9049 Malabsorption due to intolerance, not elsewhere classified: Secondary | ICD-10-CM

## 2020-11-11 DIAGNOSIS — T7840XA Allergy, unspecified, initial encounter: Secondary | ICD-10-CM | POA: Diagnosis not present

## 2020-11-11 DIAGNOSIS — J310 Chronic rhinitis: Secondary | ICD-10-CM

## 2020-11-11 NOTE — Progress Notes (Signed)
NEW PATIENT Date of Service/Encounter:  11/11/20 Referring provider: Clayborne Dana, NP Primary care provider: Christen Butter, NP  Subjective:  Taylor Choi is a 35 y.o. female with a PMHx of anxiety, chronic migraine, lactose intolerance, fatigue, paterllofemoral syndrome presenting today for evaluation of food intolerances. History obtained from: chart review and patient.   Has a longstanding history of migraines.  She also has noticed over the past several years that her GI symptoms have also worsened. (Bloating and alternates between constipation and diarrhea).  She takes gas-ex and Tums for these.   She reports that blue cheese seem to give her headaches. Peppers , tomatoes, spicy foods cause her to get "chancre sores".  She also gets headaches with chinese food, so she doesn't eat as much.  No clear triggers with GI foods.  She does not keep a food diary as it is cumbersome and she has young children (3 and 69 yo boys). She does take Emgality shots for migraines.  She is followed by headache clinic at [facility].  She has discussed some food triggers for migraines, but not in detail.  She has seen multiple Neurologists for her migraines.  She was having 15-20 per month, but this has decreased with Emgality.   She has never seen a GI specialist.  PCP visit on 10/11/20-food sensitivity with GI symptoms and headaches (bloating, diarrhea, headaches), referral to allergy, advised food diary  Other allergy screening: Asthma: no Rhino conjunctivitis: yes-takes Xyzal which she was placed on by ENT (has ear tubes, currently only right one).  Had Eustachian tube dilation last week which she feels is helpful.  Has used Nasacort and Flonase, but did not find these helpful. Food allergy: no Medication allergy: yes-Wellbutrin (rash), sports adhesive spray (rash), vitamin B12 supplements (tongue fuller and throat felt swollen on left side) Hymenoptera allergy: no Urticaria: no Eczema:yes-suffered as   a younger child, no longer active History of recurrent infections suggestive of immunodeficency: no Vaccinations are up to date.   Past Medical History: Past Medical History:  Diagnosis Date   2019 novel coronavirus disease (COVID-19) 05/2018   Anxiety    Depression    Migraines    Vaginal Pap smear, abnormal    Medication List:  Current Outpatient Medications  Medication Sig Dispense Refill   EMGALITY 120 MG/ML SOAJ SMARTSIG:1 Milliliter(s) SUB-Q     levocetirizine (XYZAL) 5 MG tablet Take 5 mg by mouth every evening.  1   venlafaxine XR (EFFEXOR-XR) 37.5 MG 24 hr capsule Take 2 capsules by mouth every morning.  1   No current facility-administered medications for this visit.   Known Allergies:  Allergies  Allergen Reactions   Other Itching and Swelling    Sports adhesive spray    Wellbutrin [Bupropion] Itching   Past Surgical History: Past Surgical History:  Procedure Laterality Date   ADENOIDECTOMY     BUNIONECTOMY     CESAREAN SECTION N/A 12/12/2013   Procedure: CESAREAN SECTION;  Surgeon: Jeani Hawking, MD;  Location: [facility];  Service: Obstetrics;  Laterality: N/A;   CESAREAN SECTION N/A 09/10/2017   Procedure: REPEAT CESAREAN SECTION;  Surgeon: Harold Hedge, MD;  Location: [facility] SUIT[facility];  Service: Obstetrics;  Laterality: N/A;  Tracey RNFA   COLPOSCOPY     EXTERNAL EAR SURGERY     TYMPANOSTOMY TUBE PLACEMENT     Family History: Family History  Problem Relation Age of Onset   Allergic rhinitis Mother    Diverticulitis Mother    Colon  polyps Father    Diabetes Father    Allergic rhinitis Brother    Diverticulitis Maternal Grandmother    Diabetes Paternal Grandmother    Diverticulitis Cousin    Colon cancer Neg Hx    Social History: Aalaiyah lives in a house built 20 years ago, no water damage, carpet in home in bedroom, electric heating, heat pump for cooling.  3 pet dogs and a hamster.  No visible cockroaches.  Her bed is at least 2 feet off the  floor and she uses dust mite free covers for bedding.  No smoke exposure and denies previous tobacco use.  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  No HEPA filter in the home.  [place]   ROS:  All other systems negative except as noted per HPI.  Objective:  Blood pressure 116/80, pulse 88, temperature 98.4 F (36.9 C), temperature source Temporal, resp. rate 18, height 5' 8.25" (1.734 m), weight 177 lb (80.3 kg), SpO2 100 %. Body mass index is 26.72 kg/m. Physical Exam:  General Appearance:  Alert, cooperative, no distress, appears stated age  Head:  Normocephalic, without obvious abnormality, atraumatic  Eyes:  Conjunctiva clear, EOM's intact  Nose: Nares normal, erythematous turbinates, scant white mucus  Throat: Lips, tongue normal; teeth and gums normal, normal posterior oropharynx, tonsils 2+  Ears EACs normal bilaterally, right TM with blue tube in place, no drainage, left TM with calcifications, no effusions  Neck: Supple, symmetrical  Lungs:   Clear to auscultation bilaterally, no wheezing respirations unlabored, no coughing  Heart:  Regular rate and rhythm, no murmur, appears well perfused  Extremities: No edema  Skin: Skin color, texture, turgor normal, no rashes or lesions on visualized portions of skin  Neurologic: No gross deficits   Diagnostics: Skin Testing: Food allergy panel. Results discussed with patient/family.  Food Adult Perc - 11/11/20 1000     Time Antigen Placed 1030    Allergen Manufacturer Waynette Buttery    Location Back    Number of allergen test 72     Control-buffer 50% Glycerol --   3*3   Control-Histamine 1 mg/ml --   6x6   1. Peanut Negative    2. Soybean Negative    3. Wheat Negative    4. Sesame Negative    5. Milk, cow Negative    6. Egg White, Chicken Negative    7. Casein Negative    8. Shellfish Mix Negative    9. Fish Mix Negative    10. Cashew Negative    11. Pecan  Food Negative    12. Walnut Food Negative    13. Almond Negative    14. Hazelnut Negative    15. [place] nut Negative    16. Coconut Negative    17. Pistachio Negative    18. Catfish Negative    19. Bass Negative    20. Trout Negative    21. Tuna Negative    22. Salmon Negative    23. Flounder Negative    24. Codfish Negative    25. Shrimp Negative    26. Crab Negative    27. Lobster Negative    28. Oyster Negative    29. Scallops Negative    30. Barley Negative    31. Oat  Negative    32. Rye  Negative    33. Hops Negative    34. Rice Negative    35. Cottonseed Negative  36. Saccharomyces Cerevisiae  Negative    37. Pork Negative    38. [place] Meat Negative    39. Chicken Meat Negative    40. Beef Negative    41. Lamb Negative    42. Tomato Negative    43. White Potato Negative    44. Sweet Potato Negative    45. Pea, Green/English Negative    46. Navy Bean Negative    47. Mushrooms Negative    48. Avocado Negative    49. Onion Negative    50. Cabbage Negative    51. Carrots Negative    52. Celery Negative    53. Corn Negative    54. Cucumber Negative    55. Grape (White seedless) Negative    56. Orange  Negative    57. Banana Negative    58. Apple Negative    59. Peach Negative    60. Strawberry Negative    61. Cantaloupe Negative    62. Watermelon Negative    63. Pineapple Negative    64. Chocolate/Cacao bean Negative    65. Karaya Gum Negative    66. Acacia (Arabic Gum) Negative    67. Cinnamon Negative    68. Nutmeg Negative    69. Ginger Negative    70. Garlic Negative    71. Pepper, black Negative    72. Mustard Negative             Allergy testing results were read and interpreted by myself, documented by clinical staff.  Assessment:  Food intolerance: On further review of patient's history, symptoms are most consistent with food intolerance and not a true type I hypersensitivity reaction. Type I hypersensitivity reactions are  characterized by hives (80-90% of episodes), respiratory distress, GI distress, and hypotension/shock, symptoms occur within 2 hours of ingestion of a particular food, resolve within 24 hours, and occur with every subsequent ingestion.  The reaction this patient is having does not fit that description, and does not represent a life-threatening allergy.  Food allergy testing detects IgE mediated type I hypersensitivity reactions.  However, did discuss that some people use this test as a guide for elimination diet although unclear whether this will be helpful.  Shared decision making to proceed with allergy testing today which showed no positives. Elimination diets with slow reintroduction of potential trigger foods are an important part of food intolerance management, in conjunction with aggressive medical management of her underlying symptoms. Did review with her some of the known food triggers for folks with migraines, so this will also be used in her food elimination diet.  Chronic rhinitis: Likely allergic in nature based on history, but will further evaluate with skin testing at her follow-up visit.  Have discussed trial of Xhance to see if this is helpful in the meantime.   Plan/Recommendations:   Patient Instructions  Food Intolerances: - allergy testing to foods of concern today showed no positives - try the suggestions below  Food Elimination Diet Instructions - keep a food diary for at least 2-4 weeks - make note if any foods consistently make symptoms worse  - if so, eliminate these foods from your diet for 2-4 weeks.   - If symptoms do not improve, stop food elimination diet.   - If symptoms do improve, then one by one reintroduce foods and make note if symptoms return.   - If they return following reintroduction, consider eliminating or reducing intake of each specific food causing symptoms. - the food allergy testing done  today should be used as a guide for this diet, but not an  absolute restriction, if eliminating these foods does not improve your symptoms, please return them to your diet (you should know within ~2 weeks whether this is helpful) - the symptoms you are having are not consistent with a life-threatening food allergy, and there is no reliable allergy testing at this time that can identify food intolerances - other lifestyle changes include things such as maintaining adequate hydration (~60 ounces of water per day) and making sure you are getting adequate sleep at night - for GI symptoms, if you do not note improvement with the elimination diet, would consider GI referral  Follow-up on October 6th at 9:30 AM to discuss environmental allergies and potential treatments! In the meantime, try Xhance and see if it is helpful (sample provided today).  Continue Xyzal as prescribed.  This note in its entirety was forwarded to the Provider who requested this consultation.  Thank you for your kind referral. I appreciate the opportunity to take part in Briseidy's care. Please do not hesitate to contact me with questions.  Sincerely,  Tonny Bollman, MD [facility]

## 2020-11-11 NOTE — Patient Instructions (Signed)
Food Intolerances: - allergy testing to foods of concern today showed no positives - try the suggestions below  Food Elimination Diet Instructions - keep a food diary for at least 2-4 weeks - make note if any foods consistently make symptoms worse (may need separate journals for headaches and GI symptoms) - if so, eliminate these foods from your diet for 2-4 weeks.   - If symptoms do not improve, stop food elimination diet.   - If symptoms do improve, then one by one reintroduce foods and make note if symptoms return.   - If they return following reintroduction, consider eliminating or reducing intake of each specific food causing symptoms. - consider GI referral if not controlled with the above changes  - other headache lifestyle changes include things such as maintaining adequate hydration (~60 ounces of water per day) and making sure you are getting adequate sleep at night  Follow-up on October 6th at 9:30 AM to discuss environmental allergies and potential treatments! In the meantime, try Xhance and see if it is helpful (sample provided today).   Common Foods That Trigger Migraines and How to Swap Them Out ([employer])   At the [employer]: Skip the brie, try the mozzarella. If you're going to start or end with a cheese plate, know that aged cheeses such as cheddar, blue, brie, Swiss, parmesan and Roquefort contain a natural compound called tyramine, which may trigger a migraine in some. The National Headache Foundation suggests limiting intake to four ounces for aged cheeses, but if you'd rather not take any chances, go for fresh cheese like mozzarella and ricotta. At the [facility]: Skip the pickles, try raw cucumber. A few favorite burger toppings can be migraine triggers for some, all thanks to tyramine, so the next time you hit up your fave joint, be wary of a few items like raw onion, cheddar or blue cheese and sauerkraut (for you non-traditionalists). Pickled food can be  high in tyramine, too, so you might consider laying off that pile of pickles. It might sound weird, but raw cucumber can give you that same satisfying crunch, so you might ask your server for a swap-out. At the [facility]: Skip the pepperoni, try a classic. Aged, dried, fermented or smoked meats are (you guessed it) high in tyramine, so that pepperoni-lover's pizza could cause a migraine for some. Stick to a classic [employer] version (mozzarella cheese is a-ok), or load up your slice with veggies. At the [facility]: Skip snow peas, try .anything else. You're all good when sticking to raw, fresh veggies at the salad bar, except for snow peas, which contain tyramine. Broad beans such as favas also contain tyramine, so consider passing them by, as well. And about the dressing: Citrus such as orange, lemon and lime can contain tyramine. But the National Headache Foundation's low-tyramine diet suggests limiting citrus to half a cup serving per day, so a spritz of lemon on your salad hopefully won't be an issue. At the [facility]: Skip the teriyaki, try steamed. This one might hurt, but it's true: Fermented soy products, such as miso, soy sauce, and teriyaki sauce are foods that can trigger migraines thanks to their high tyramine levels. So if this compound is a trigger for you, the sushi or teriyaki place around the corner might not be your idea of a nice lunch out. Never fear: Ask for a steamed or grilled entree instead, and learn to love your sushi without dousing it in sauce. The National Headache  Foundation suggests limiting these sauces to one ounce per day. Not sure if tyramine is a trigger for you? Keeping track of what you do and don't eat in a migraine diary can help you determine which foods are migraine triggers. And once you have an idea of your triggers, restaurants can become less of a headache landmine -- and more of the enjoyable destination they're meant to be!  Other great resources: National  Headache Foundatoin

## 2020-12-01 NOTE — Progress Notes (Signed)
FOLLOW UP Date of Service/Encounter:  12/02/20   Subjective:  Taylor Choi (DOB: September 11, 1985) is a 35 y.o. female [facility] in re-evaluation of the following: Chronic rhinitis and food intolerance History obtained from: chart review and patient.  For Review, LV was on 11/11/2020 with Dr.Brendaliz Kuk seen for food intolerance and migraines as well as rhinoconjunctivitis with a history of eustachian tube dilation recently as well as tympanostomy tubes for chronic eustachian tube dysfunction.  Adult food panel negative at last visit.  Discussed food elimination diet as well as provided information on food triggers of migraines.  Follow-up scheduled for environmental allergy testing for chronic rhinitis.  Patient started on Xhance and continued on Xyzal.  Today she presents for follow-up. She reports she has been doing well since her last visit.  Unfortunately, she has not been able to do food journaling due to having young children.  She has been trying to avoid some of the known food triggers for folks with migraines.  Soy seems to be a source of migraine for her.   Her chronic rhinitis remains stable.  Continues taking Xyzal.  She has not tried [employer] spray which was provided at last visit.  She has done Flonase and Nasacort in the past and did not find these helpful.  Allergies as of 12/02/2020       Reactions   Other Itching, Swelling   Sports adhesive spray    Wellbutrin [bupropion] Itching        Medication List        Accurate as of December 02, 2020  1:42 PM. If you have any questions, ask your nurse or doctor.          azelastine 0.1 % nasal spray Commonly known as: ASTELIN Place 2 sprays into both nostrils 2 (two) times daily as needed for rhinitis. Use in each nostril as directed Started by: Tonny Bollman, MD   Emgality 120 MG/ML Soaj Generic drug: Galcanezumab-gnlm SMARTSIG:1 Milliliter(s) SUB-Q   levocetirizine 5 MG  tablet Commonly known as: XYZAL Take 5 mg by mouth every evening.   venlafaxine XR 37.5 MG 24 hr capsule Commonly known as: EFFEXOR-XR Take 2 capsules by mouth every morning.       Past Medical History:  Diagnosis Date   2019 novel coronavirus disease (COVID-19) 05/2018   Anxiety    Depression    Migraines    Vaginal Pap smear, abnormal    Past Surgical History:  Procedure Laterality Date   ADENOIDECTOMY     BUNIONECTOMY     CESAREAN SECTION N/A 12/12/2013   Procedure: CESAREAN SECTION;  Surgeon: Jeani Hawking, MD;  Location: [facility];  Service: Obstetrics;  Laterality: N/A;   CESAREAN SECTION N/A 09/10/2017   Procedure: REPEAT CESAREAN SECTION;  Surgeon: Harold Hedge, MD;  Location: [facility];  Service: Obstetrics;  Laterality: N/A;  Tracey RNFA   COLPOSCOPY     EXTERNAL EAR SURGERY     TYMPANOSTOMY TUBE PLACEMENT     Otherwise, there have been no changes to her past medical history, surgical history, family history, or social history.  ROS: All others negative except as noted per HPI.   Objective:  BP 114/80   Pulse 90   Temp 98.6 F (37 C) (Temporal)   Resp 17   Ht 5\' 9"  (1.753 m)   Wt 177 lb 9.6 oz (80.6 kg)   SpO2 99%   BMI 26.23 kg/m  Body mass  index is 26.23 kg/m. Physical Exam: General Appearance:  Alert, cooperative, no distress, appears stated age  Head:  Normocephalic, without obvious abnormality, atraumatic  Eyes:  Conjunctiva clear, EOM's intact  Nose: Nares normal  Throat: Lips, tongue normal; teeth and gums normal  Neck: Supple, symmetrical  Lungs:   Respirations unlabored, no coughing  Heart:  Appears well perfused  Extremities: No edema  Skin: Skin color, texture, turgor normal, no rashes or lesions on visualized portions of skin  Neurologic: No gross deficits   Skin Testing: Environmental allergy panel. Positive test to: None.  Adequate positive and negative controls. Results discussed with patient/family.  Airborne Adult  Perc - 12/02/20 0946     Time Antigen Placed 0946    Allergen Manufacturer Waynette Buttery    Location Back    Number of Test 59    Panel 1 Select    1. Control-Buffer 50% Glycerol Negative    2. Control-Histamine 1 mg/ml 3+    3. Albumin saline Negative    4. Bahia Negative    5. French Southern Territories Negative    6. Johnson Negative    7. Kentucky Blue Negative    8. Meadow Fescue Negative    9. Perennial Rye Negative    10. Sweet Vernal Negative    11. Timothy Negative    12. Cocklebur Negative    13. Burweed Marshelder Negative    14. Ragweed, short Negative    15. Ragweed, Giant Negative    16. Plantain,  English Negative    17. Lamb's Quarters Negative    18. Sheep Sorrell Negative    19. Rough Pigweed Negative    20. Marsh Elder, Rough Negative    21. Mugwort, Common Negative    22. Ash mix Negative    23. Birch mix Negative    24. Beech American Negative    25. Box, Elder Negative    26. Cedar, red Negative    27. Cottonwood, Guinea-Bissau Negative    28. Elm mix Negative    29. Hickory Negative    30. Maple mix Negative    31. Oak, Guinea-Bissau mix Negative    32. Pecan Pollen Negative    33. Pine mix Negative    34. Sycamore Eastern Negative    35. Walnut, Black Pollen Negative    36. Alternaria alternata Negative    37. Cladosporium Herbarum Negative    38. Aspergillus mix Negative    39. Penicillium mix Negative    40. Bipolaris sorokiniana (Helminthosporium) Negative    41. Drechslera spicifera (Curvularia) Negative    42. Mucor plumbeus Negative    43. Fusarium moniliforme Negative    44. Aureobasidium pullulans (pullulara) Negative    45. Rhizopus oryzae Negative    46. Botrytis cinera Negative    47. Epicoccum nigrum Negative    48. Phoma betae Negative    49. Candida Albicans Negative    50. Trichophyton mentagrophytes Negative    51. Mite, D Farinae  5,000 AU/ml Negative    52. Mite, D Pteronyssinus  5,000 AU/ml Negative    53. Cat Hair 10,000 BAU/ml Negative    54.  Dog  Epithelia Negative    55. Mixed Feathers Negative    56. Horse Epithelia Negative    57. Cockroach, German Negative    58. Mouse Negative    59. Tobacco Leaf Negative             Intradermal - 12/02/20 1113     Time Antigen Placed 1113  Allergen Manufacturer Greer    Location Arm    Number of Test 15    Intradermal Select    Control Negative    French Southern Territories Negative    Johnson Negative    7 Grass Negative    Ragweed mix Negative    Weed mix Negative    Tree mix Negative    Mold 1 Negative    Mold 2 Negative    Mold 3 Negative    Mold 4 Negative    Cat Negative    Dog Negative    Cockroach Negative    Mite mix Negative             Allergy testing results were read and interpreted by myself, documented by clinical staff.  Assessment:  Chronic rhinitis - Plan: Allergy Test, azelastine (ASTELIN) 0.1 % nasal spray, Interdermal Allergy Test -Nonallergic based on SPT.  Management as below Food intolerance -Unidentified foods provoke migraines.  Previous food allergy testing negative.  Guidance on food intolerance and migraine diets as below. Plan/Recommendations:   Patient Instructions  Chronic Rhinitis : Non allergic: - allergy testing today was negative -Start [employer] 2 sprays daily. Best results if used daily. - Start Astelin (Azelastine) 1-2 sprays in each nostril twice a day as needed.  You may use this as needed for nasal congestion/itchy ears/itchy nose if desired - Continue over the counter antihistamine daily or daily as needed.  Your options include Zyrtec (Cetirizine) 10mg , Claritin (Loratadine) 10mg , Allegra (Fexofenadine) 180mg , or Xyzal (Levocetirinze) 5mg   Food Intolerances: - allergy testing to foods of concern at last visit showed no positives at our last visit - try the suggestions below  Food Elimination Diet Instructions - keep a food diary for at least 2-4 weeks - make note if any foods consistently make symptoms worse (may need separate  journals for headaches and GI symptoms) - if so, eliminate these foods from your diet for 2-4 weeks.   - If symptoms do not improve, stop food elimination diet.   - If symptoms do improve, then one by one reintroduce foods and make note if symptoms return.   - If they return following reintroduction, consider eliminating or reducing intake of each specific food causing symptoms. - consider GI referral if not controlled with the above changes  - other headache lifestyle changes include things such as maintaining adequate hydration (~60 ounces of water per day) and making sure you are getting adequate sleep at night  Return if symptoms worsen or fail to improve.  , MD  [facility]

## 2020-12-02 ENCOUNTER — Ambulatory Visit (INDEPENDENT_AMBULATORY_CARE_PROVIDER_SITE_OTHER): Payer: 59 | Admitting: Internal Medicine

## 2020-12-02 ENCOUNTER — Other Ambulatory Visit: Payer: Self-pay

## 2020-12-02 ENCOUNTER — Encounter: Payer: Self-pay | Admitting: Internal Medicine

## 2020-12-02 VITALS — BP 114/80 | HR 90 | Temp 98.6°F | Resp 17 | Ht 69.0 in | Wt 177.6 lb

## 2020-12-02 DIAGNOSIS — J310 Chronic rhinitis: Secondary | ICD-10-CM | POA: Diagnosis not present

## 2020-12-02 DIAGNOSIS — K9049 Malabsorption due to intolerance, not elsewhere classified: Secondary | ICD-10-CM | POA: Diagnosis not present

## 2020-12-02 MED ORDER — AZELASTINE HCL 0.1 % NA SOLN: 2.0000 | Freq: Two times a day (BID) | NASAL | 6 refills | Status: DC | PRN

## 2020-12-02 NOTE — Patient Instructions (Signed)
Chronic Rhinitis : Non allergic: - allergy testing today was negative -Start [employer] 2 sprays daily. Best results if used daily. - Start Astelin (Azelastine) 1-2 sprays in each nostril twice a day as needed.  You may use this as needed for nasal congestion/itchy ears/itchy nose if desired - Continue over the counter antihistamine daily or daily as needed.  Your options include Zyrtec (Cetirizine) 10mg , Claritin (Loratadine) 10mg , Allegra (Fexofenadine) 180mg , or Xyzal (Levocetirinze) 5mg   Food Intolerances: - allergy testing to foods of concern at last visit showed no positives at our last visit - try the suggestions below  Food Elimination Diet Instructions - keep a food diary for at least 2-4 weeks - make note if any foods consistently make symptoms worse (may need separate journals for headaches and GI symptoms) - if so, eliminate these foods from your diet for 2-4 weeks.   - If symptoms do not improve, stop food elimination diet.   - If symptoms do improve, then one by one reintroduce foods and make note if symptoms return.   - If they return following reintroduction, consider eliminating or reducing intake of each specific food causing symptoms. - consider GI referral if not controlled with the above changes  - other headache lifestyle changes include things such as maintaining adequate hydration (~60 ounces of water per day) and making sure you are getting adequate sleep at night  Common Foods That Trigger Migraines and How to Swap Them Out ( )   At the [facility]: Skip the brie, try the mozzarella. If you're going to start or end with a cheese plate, know that aged cheeses such as cheddar, blue, brie, Swiss, parmesan and Roquefort contain a natural compound called tyramine, which may trigger a migraine in some. The National Headache Foundation suggests limiting intake to four ounces for aged cheeses, but if you'd rather not take any chances, go for fresh cheese  like mozzarella and ricotta. At the [facility]: Skip the pickles, try raw cucumber. A few favorite burger toppings can be migraine triggers for some, all thanks to tyramine, so the next time you hit up your fave joint, be wary of a few items like raw onion, cheddar or blue cheese and sauerkraut (for you non-traditionalists). Pickled food can be high in tyramine, too, so you might consider laying off that pile of pickles. It might sound weird, but raw cucumber can give you that same satisfying crunch, so you might ask your server for a swap-out. At the [facility]: Skip the pepperoni, try a classic. Aged, dried, fermented or smoked meats are (you guessed it) high in tyramine, so that pepperoni-lover's pizza could cause a migraine for some. Stick to a classic version (mozzarella cheese is a-ok), or load up your slice with veggies. At the [facility]: Skip snow peas, try .anything else. You're all good when sticking to raw, fresh veggies at the salad bar, except for snow peas, which contain tyramine. Broad beans such as favas also contain tyramine, so consider passing them by, as well. And about the dressing: Citrus such as orange, lemon and lime can contain tyramine. But the National Headache Foundation's low-tyramine diet suggests limiting citrus to half a cup serving per day, so a spritz of lemon on your salad hopefully won't be an issue. At the [facility]: Skip the teriyaki, try steamed. This one might hurt, but it's true: Fermented soy products, such as miso, soy sauce, and teriyaki sauce are foods that can trigger migraines thanks to  their high tyramine levels. So if this compound is a trigger for you, the sushi or teriyaki place around the corner might not be your idea of a nice lunch out. Never fear: Ask for a steamed or grilled entree instead, and learn to love your sushi without dousing it in sauce. The National Headache Foundation suggests limiting these sauces to one ounce per day. Not  sure if tyramine is a trigger for you? Keeping track of what you do and don't eat in a migraine diary can help you determine which foods are migraine triggers. And once you have an idea of your triggers, restaurants can become less of a headache landmine -- and more of the enjoyable destination they're meant to be!  Other great resources: National Headache Foundatoin

## 2021-01-10 ENCOUNTER — Encounter: Payer: Self-pay | Admitting: Medical-Surgical

## 2021-01-10 ENCOUNTER — Other Ambulatory Visit: Payer: Self-pay

## 2021-01-10 ENCOUNTER — Ambulatory Visit (INDEPENDENT_AMBULATORY_CARE_PROVIDER_SITE_OTHER): Payer: 59 | Admitting: Medical-Surgical

## 2021-01-10 VITALS — BP 119/80 | HR 96 | Resp 20 | Ht 69.0 in | Wt 180.0 lb

## 2021-01-10 DIAGNOSIS — M25532 Pain in left wrist: Secondary | ICD-10-CM

## 2021-01-10 DIAGNOSIS — F418 Other specified anxiety disorders: Secondary | ICD-10-CM

## 2021-01-10 DIAGNOSIS — Z0000 Encounter for general adult medical examination without abnormal findings: Secondary | ICD-10-CM

## 2021-01-10 DIAGNOSIS — K5900 Constipation, unspecified: Secondary | ICD-10-CM | POA: Diagnosis not present

## 2021-01-10 NOTE — Progress Notes (Signed)
HPI: Taylor Choi is a 35 y.o. female who  has a past medical history of 2019 novel coronavirus disease (COVID-19) (05/2018), Anxiety, Depression, Migraines, and Vaginal Pap smear, abnormal.  she presents to [facility] today, 01/10/21,  for chief complaint of: Annual physical exam  Dentist: Overdue, plans to call and reschedule Eye exam: UTD, glasses and contacts Exercise: none intentional Diet: no restrictions Pap smear: UTD COVID vaccine: done, no booster  Concerns: Constipation  Past medical, surgical, social and family history reviewed:  Patient Active Problem List   Diagnosis Date Noted   Food intolerance 12/02/2020   Patellofemoral syndrome, left 11/17/2019   Depression with anxiety 11/04/2019   Cervicalgia 11/04/2019   Chest pain 11/04/2019   Fatigue 11/04/2019   Anxiety 01/02/2019   H/O cesarean section 09/10/2017   S/P cesarean section 12/12/2013   Post term pregnancy over 40 weeks 12/11/2013   Lactose intolerance 02/24/2013   Chronic migraine 11/19/2012    Past Surgical History:  Procedure Laterality Date   ADENOIDECTOMY     BUNIONECTOMY     CESAREAN SECTION N/A 12/12/2013   Procedure: CESAREAN SECTION;  Surgeon: Cyril Mourning, MD;  Location: [place] [facility];  Service: Obstetrics;  Laterality: N/A;   CESAREAN SECTION N/A 09/10/2017   Procedure: REPEAT CESAREAN SECTION;  Surgeon: Everlene Farrier, MD;  Location: [place];  Service: Obstetrics;  Laterality: N/A;  Tracey RNFA   COLPOSCOPY     EXTERNAL EAR SURGERY     TYMPANOSTOMY TUBE PLACEMENT      Social History   Tobacco Use   Smoking status: Never   Smokeless tobacco: Never  Substance Use Topics   Alcohol use: Yes    Alcohol/week: 1.0 standard drink    Types: 1 Standard drinks or equivalent per week    Comment: rarely    Family History  Problem Relation Age of Onset   Allergic rhinitis Mother    Diverticulitis Mother    Colon polyps Father     Diabetes Father    Allergic rhinitis Brother    Diverticulitis Maternal Grandmother    Diabetes Paternal Grandmother    Diverticulitis Cousin    Colon cancer Neg Hx      Current medication list and allergy/intolerance information reviewed:    Current Outpatient Medications  Medication Sig Dispense Refill   azelastine (ASTELIN) 0.1 % nasal spray Place 2 sprays into both nostrils 2 (two) times daily as needed for rhinitis. Use in each nostril as directed 30 mL 6   EMGALITY 120 MG/ML SOAJ SMARTSIG:1 Milliliter(s) SUB-Q     levocetirizine (XYZAL) 5 MG tablet Take 5 mg by mouth every evening.  1   venlafaxine XR (EFFEXOR-XR) 37.5 MG 24 hr capsule Take 2 capsules by mouth every morning.  1   No current facility-administered medications for this visit.    Allergies  Allergen Reactions   Other Itching and Swelling    Sports adhesive spray    Wellbutrin [Bupropion] Itching      Review of Systems: Constitutional:  No  fever, no chills, No recent illness, No unintentional weight changes. No significant fatigue.  HEENT: No  headache, no vision change, no hearing change, No sore throat, No  sinus pressure Cardiac: No  chest pain, No  pressure, No palpitations, No  Orthopnea Respiratory:  No  shortness of breath. No  Cough Gastrointestinal: No  abdominal pain, No  nausea, No  vomiting,  No  blood in stool, No  diarrhea, + constipation  Musculoskeletal: + left wrist pain Skin: No  Rash, No other wounds/concerning lesions Genitourinary: No  incontinence, No  abnormal genital bleeding, No abnormal genital discharge Hem/Onc: No  easy bruising/bleeding, No  abnormal lymph node Endocrine: No cold intolerance,  No heat intolerance. No polyuria/polydipsia/polyphagia  Neurologic: No  weakness, No  dizziness, No  slurred speech/focal weakness/facial droop Psychiatric: No  concerns with depression, No  concerns with anxiety, No sleep problems, No mood problems  Exam:  BP 119/80 (BP Location: Right  Arm, Patient Position: Sitting, Cuff Size: Large)   Pulse 96   Resp 20   Ht '5\' 9"'  (1.753 m)   Wt 180 lb (81.6 kg)   SpO2 100%   BMI 26.58 kg/m  Constitutional: VS see above. General Appearance: alert, well-developed, well-nourished, NAD Eyes: Normal lids and conjunctive, non-icteric sclera Ears, Nose, Mouth, Throat: MMM, Normal external inspection ears/nares/mouth/lips/gums. TM normal bilaterally.  Neck: No masses, trachea midline. No thyroid enlargement. No tenderness/mass appreciated. No lymphadenopathy Respiratory: Normal respiratory effort. no wheeze, no rhonchi, no rales Cardiovascular: S1/S2 normal, no murmur, no rub/gallop auscultated. RRR. No lower extremity edema. Pedal pulse II/IV bilaterally PT. No carotid bruit or JVD. No abdominal aortic bruit. Gastrointestinal: Nontender, no masses. No hepatomegaly, no splenomegaly. No hernia appreciated. Bowel sounds normal. Rectal exam deferred.  Musculoskeletal: Gait normal. No clubbing/cyanosis of digits. + left wrist pain along the radial side with dorsiflexion of the wrist.  Neurological: Normal balance/coordination. No tremor. No cranial nerve deficit on limited exam. Motor and sensation intact and symmetric. Cerebellar reflexes intact.  Skin: warm, dry, intact. No rash/ulcer. No concerning nevi or subq nodules on limited exam.   Psychiatric: Normal judgment/insight. Normal mood and affect. Oriented x3.    ASSESSMENT/PLAN:   1. Annual physical exam Checking labs. Wellness information given via AVS.  - Lipid panel - CBC with Differential  2. Depression with anxiety Managed by psychiatry.   3. Constipation Likely medication related. Recommend continuing daily fiber supplementation. Stay well hydrated. Ok to use Miralax 17g daily as needed. Occasional use of Senokot (no more than once every 1-2 weeks) ok for severe constipation.   4. Left wrist pain Recommend nighttime bracing and use of RICE. Avoid oral anti-inflammatories due to  migraine issues. Consider use of topical Voltaren gel.   Orders Placed This Encounter  Procedures   HM PAP SMEAR   Lipid panel   CBC with Differential    No orders of the defined types were placed in this encounter.   Patient Instructions  Preventive Care 35-71 Years Old, Female Preventive care refers to lifestyle choices and visits with your health care provider that can promote health and wellness. Preventive care visits are also called wellness exams. What can I expect for my preventive care visit? Counseling During your preventive care visit, your health care provider may ask about your: Medical history, including: Past medical problems. Family medical history. Pregnancy history. Current health, including: Menstrual cycle. Method of birth control. Emotional well-being. Home life and relationship well-being. Sexual activity and sexual health. Lifestyle, including: Alcohol, nicotine or tobacco, and drug use. Access to firearms. Diet, exercise, and sleep habits. Work and work Statistician. Sunscreen use. Safety issues such as seatbelt and bike helmet use. Physical exam Your health care provider may check your: Height and weight. These may be used to calculate your BMI (body mass index). BMI is a measurement that tells if you are at a healthy weight. Waist circumference. This measures the distance around your waistline. This measurement also tells  if you are at a healthy weight and may help predict your risk of certain diseases, such as type 2 diabetes and high blood pressure. Heart rate and blood pressure. Body temperature. Skin for abnormal spots. What immunizations do I need? Vaccines are usually given at various ages, according to a schedule. Your health care provider will recommend vaccines for you based on your age, medical history, and lifestyle or other factors, such as travel or where you work. What tests do I need? Screening Your health care provider may  recommend screening tests for certain conditions. This may include: Pelvic exam and Pap test. Lipid and cholesterol levels. Diabetes screening. This is done by checking your blood sugar (glucose) after you have not eaten for a while (fasting). Hepatitis B test. Hepatitis C test. HIV (human immunodeficiency virus) test. STI (sexually transmitted infection) testing, if you are at risk. BRCA-related cancer screening. This may be done if you have a family history of breast, ovarian, tubal, or peritoneal cancers. Talk with your health care provider about your test results, treatment options, and if necessary, the need for more tests. Follow these instructions at home: Eating and drinking  Eat a healthy diet that includes fresh fruits and vegetables, whole grains, lean protein, and low-fat dairy products. Take vitamin and mineral supplements as recommended by your health care provider. Do not drink alcohol if: Your health care provider tells you not to drink. You are pregnant, may be pregnant, or are planning to become pregnant. If you drink alcohol: Limit how much you have to 0-1 drink a day. Know how much alcohol is in your drink. In the [place] one drink equals one 12 oz bottle of beer (355 mL), one 5 oz glass of wine (148 mL), or one 1 oz glass of hard liquor (44 mL). Lifestyle Brush your teeth every morning and night with fluoride toothpaste. Floss one time each day. Exercise for at least 30 minutes 5 or more days each week. Do not use any products that contain nicotine or tobacco. These products include cigarettes, chewing tobacco, and vaping devices, such as e-cigarettes. If you need help quitting, ask your health care provider. Do not use drugs. If you are sexually active, practice safe sex. Use a condom or other form of protection to prevent STIs. If you do not wish to become pregnant, use a form of birth control. If you plan to become pregnant, see your health care provider for a  prepregnancy visit. Find healthy ways to manage stress, such as: Meditation, yoga, or listening to music. Journaling. Talking to a trusted person. Spending time with friends and family. Minimize exposure to UV radiation to reduce your risk of skin cancer. Safety Always wear your seat belt while driving or riding in a vehicle. Do not drive: If you have been drinking alcohol. Do not ride with someone who has been drinking. If you have been using any mind-altering substances or drugs. While texting. When you are tired or distracted. Wear a helmet and other protective equipment during sports activities. If you have firearms in your house, make sure you follow all gun safety procedures. Seek help if you have been physically or sexually abused. What's next? Go to your health care provider once a year for an annual wellness visit. Ask your health care provider how often you should have your eyes and teeth checked. Stay up to date on all vaccines. This information is not intended to replace advice given to you by your health care provider. Make sure  you discuss any questions you have with your health care provider. Document Revised: 08/11/2020 Document Reviewed: 08/11/2020 Elsevier Patient Education  [place].  Follow-up plan: Return in about 1 year (around 01/10/2022) for annual physical exam or sooner if needed.  Clearnce Sorrel, DNP, APRN, FNP-BC [facility]

## 2021-01-10 NOTE — Patient Instructions (Signed)

## 2021-01-12 LAB — CBC WITH DIFFERENTIAL/PLATELET
Absolute Monocytes: 372 cells/uL (ref 200–950)
Basophils Absolute: 10 cells/uL (ref 0–200)
Basophils Relative: 0.2 %
Eosinophils Absolute: 0 cells/uL — ABNORMAL LOW (ref 15–500)
Eosinophils Relative: 0 %
HCT: 41.4 % (ref 35.0–45.0)
Hemoglobin: 13.6 g/dL (ref 11.7–15.5)
Lymphs Abs: 1938 cells/uL (ref 850–3900)
MCH: 28.8 pg (ref 27.0–33.0)
MCHC: 32.9 g/dL (ref 32.0–36.0)
MCV: 87.5 fL (ref 80.0–100.0)
MPV: 9.8 fL (ref 7.5–12.5)
Monocytes Relative: 7.3 %
Neutro Abs: 2780 cells/uL (ref 1500–7800)
Neutrophils Relative %: 54.5 %
Platelets: 334 10*3/uL (ref 140–400)
RBC: 4.73 10*6/uL (ref 3.80–5.10)
RDW: 12.2 % (ref 11.0–15.0)
Total Lymphocyte: 38 %
WBC: 5.1 10*3/uL (ref 3.8–10.8)

## 2021-01-12 LAB — LIPID PANEL
Cholesterol: 172 mg/dL (ref <200)
HDL: 54 mg/dL (ref >=50)
LDL Cholesterol (Calc): 104 mg/dL (calc) — ABNORMAL HIGH
Non-HDL Cholesterol (Calc): 118 mg/dL (calc) (ref <130)
Total CHOL/HDL Ratio: 3.2 (calc) (ref <5.0)
Triglycerides: 59 mg/dL (ref <150)

## 2021-05-17 LAB — OB RESULTS CONSOLE ABO/RH: RH Type: POSITIVE

## 2021-05-17 LAB — OB RESULTS CONSOLE HEPATITIS B SURFACE ANTIGEN: Hepatitis B Surface Ag: NEGATIVE

## 2021-05-17 LAB — OB RESULTS CONSOLE HIV ANTIBODY (ROUTINE TESTING): HIV: NONREACTIVE

## 2021-05-17 LAB — OB RESULTS CONSOLE RUBELLA ANTIBODY, IGM: Rubella: IMMUNE

## 2021-05-17 LAB — HEPATITIS C ANTIBODY: HCV Ab: NEGATIVE

## 2021-05-17 LAB — OB RESULTS CONSOLE ANTIBODY SCREEN: Antibody Screen: NEGATIVE

## 2021-05-17 LAB — OB RESULTS CONSOLE RPR: RPR: NONREACTIVE

## 2021-05-30 LAB — OB RESULTS CONSOLE GC/CHLAMYDIA
Chlamydia: NEGATIVE
Neisseria Gonorrhea: NEGATIVE

## 2021-11-28 LAB — OB RESULTS CONSOLE GBS: GBS: NEGATIVE

## 2021-12-02 ENCOUNTER — Telehealth (HOSPITAL_COMMUNITY): Payer: Self-pay | Admitting: *Deleted

## 2021-12-02 ENCOUNTER — Encounter (HOSPITAL_COMMUNITY): Payer: Self-pay | Admitting: *Deleted

## 2021-12-02 ENCOUNTER — Encounter (HOSPITAL_COMMUNITY): Payer: Self-pay

## 2021-12-02 NOTE — Telephone Encounter (Signed)
Preadmission screen  

## 2021-12-02 NOTE — Patient Instructions (Addendum)
Taylor Choi  12/02/2021   Your procedure is scheduled on:  12/16/2021  Arrive at [place] at Entrance C on [address] at [facility]  and [employer]. You are invited to use the FREE valet parking or use the Visitor's parking deck.  Pick up the phone at the desk and dial [phone].  Call this number if you have problems the morning of surgery: [phone]  Remember:   Do not eat food:(After Midnight) Desps de medianoche.  Do not drink clear liquids: (After Midnight) Desps de medianoche.  Take these medicines the morning of surgery with A SIP OF WATER:  none   Do not wear jewelry, make-up or nail polish.  Do not wear lotions, powders, or perfumes. Do not wear deodorant.  Do not shave 48 hours prior to surgery.  Do not bring valuables to the hospital.  [facility] is not   responsible for any belongings or valuables brought to the hospital.  Contacts, dentures or bridgework may not be worn into surgery.  Leave suitcase in the car. After surgery it may be brought to your room.  For patients admitted to the hospital, checkout time is 11:00 AM the day of              discharge.      Please read over the following fact sheets that you were given:     Preparing for Surgery

## 2021-12-05 ENCOUNTER — Encounter (HOSPITAL_COMMUNITY): Payer: Self-pay

## 2021-12-14 ENCOUNTER — Other Ambulatory Visit (HOSPITAL_COMMUNITY)
Admission: RE | Admit: 2021-12-14 | Discharge: 2021-12-14 | Disposition: A | Discharge status: Home / Self Care | Payer: 59 | Source: Ambulatory Visit | Attending: Obstetrics and Gynecology | Admitting: Obstetrics and Gynecology

## 2021-12-14 DIAGNOSIS — Z98891 History of uterine scar from previous surgery: Secondary | ICD-10-CM | POA: Insufficient documentation

## 2021-12-14 LAB — CBC
HCT: 33 % — ABNORMAL LOW (ref 36.0–46.0)
Hemoglobin: 11.4 g/dL — ABNORMAL LOW (ref 12.0–15.0)
MCH: 30.4 pg (ref 26.0–34.0)
MCHC: 34.5 g/dL (ref 30.0–36.0)
MCV: 88 fL (ref 80.0–100.0)
Platelets: 201 10*3/uL (ref 150–400)
RBC: 3.75 MIL/uL — ABNORMAL LOW (ref 3.87–5.11)
RDW: 14.3 % (ref 11.5–15.5)
WBC: 8.9 10*3/uL (ref 4.0–10.5)
nRBC: 0 % (ref 0.0–0.2)

## 2021-12-14 LAB — TYPE AND SCREEN
ABO/RH(D): O POS
Antibody Screen: NEGATIVE

## 2021-12-15 LAB — RPR: RPR Ser Ql: NONREACTIVE

## 2021-12-15 NOTE — H&P (Deleted)
  The note originally documented on this encounter has been moved the the encounter in which it belongs.  

## 2021-12-15 NOTE — H&P (Signed)
Taylor Choi is a 37 y.o. female presenting for repeat cesarean section. Pregnancy complicated by AMA>Panorama low risk female. OB History     Gravida  3   Para  2   Term  2   Preterm      AB      Living  2      SAB      IAB      Ectopic      Multiple  0   Live Births  2          Past Medical History:  Diagnosis Date   2019 novel coronavirus disease (COVID-19) 05/2018   Anxiety    Depression    Migraines    Vaginal Pap smear, abnormal    Past Surgical History:  Procedure Laterality Date   ADENOIDECTOMY     BUNIONECTOMY     CESAREAN SECTION N/A 12/12/2013   Procedure: CESAREAN SECTION;  Surgeon: Cyril Mourning, MD;  Location: [facility];  Service: Obstetrics;  Laterality: N/A;   CESAREAN SECTION N/A 09/10/2017   Procedure: REPEAT CESAREAN SECTION;  Surgeon: Everlene Farrier, MD;  Location: [place];  Service: Obstetrics;  Laterality: N/A;  Tracey RNFA   COLPOSCOPY     EXTERNAL EAR SURGERY     TYMPANOSTOMY TUBE PLACEMENT     Family History: family history includes Allergic rhinitis in her brother and mother; Colon polyps in her father; Diabetes in her father and paternal grandmother; Diverticulitis in her cousin, maternal grandmother, and mother; Parkinson's disease in her father; Prostate cancer in her father. Social History:  reports that she has never smoked. She has never used smokeless tobacco. She reports current alcohol use of about 1.0 standard drink of alcohol per week. She reports that she does not use drugs.     Maternal Diabetes: No Genetic Screening: Normal Maternal Ultrasounds/Referrals: Normal Fetal Ultrasounds or other Referrals:  None Maternal Substance Abuse:  No Significant Maternal Medications:  None Significant Maternal Lab Results:  Group B Strep negative Number of Prenatal Visits:greater than 3 verified prenatal visits Other Comments:  None  Review of Systems  Constitutional:  Negative for fever.  Eyes:  Negative for  visual disturbance.  Gastrointestinal:  Negative for abdominal pain.  Neurological:  Negative for headaches.   Maternal Medical History:  Fetal activity: Perceived fetal activity is normal.       There were no vitals taken for this visit. Maternal Exam:  Abdomen: Fetal presentation: vertex   Physical Exam Cardiovascular:     Rate and Rhythm: Normal rate.  Pulmonary:     Effort: Pulmonary effort is normal.     Prenatal labs: ABO, Rh: --/--/O POS (10/18 0109) Antibody: NEG (10/18 0903) Rubella: Immune (03/21 0000) RPR: NON REACTIVE (10/18 0902)  HBsAg: Negative (03/21 0000)  HIV: Non-reactive (03/21 0000)  GBS: Negative/-- (10/02 0000)   Assessment/Plan: 36 yo G3P2 with two previous C/S for repeat   Shon Millet II 12/15/2021, 6:01 PM

## 2021-12-15 NOTE — Anesthesia Preprocedure Evaluation (Addendum)
Anesthesia Evaluation  Patient identified by MRN, date of birth, ID band Patient awake    Reviewed: Allergy & Precautions, NPO status , Patient's Chart, lab work & pertinent test results  History of Anesthesia Complications Negative for: history of anesthetic complications  Airway Mallampati: II  TM Distance: >3 FB Neck ROM: Full    Dental no notable dental hx.    Pulmonary neg pulmonary ROS,    Pulmonary exam normal        Cardiovascular negative cardio ROS Normal cardiovascular exam     Neuro/Psych  Headaches, Anxiety Depression    GI/Hepatic negative GI ROS, Neg liver ROS,   Endo/Other  negative endocrine ROS  Renal/GU negative Renal ROS  negative genitourinary   Musculoskeletal negative musculoskeletal ROS (+)   Abdominal   Peds  Hematology  (+) Blood dyscrasia (Hgb 11.4, Plts 201k), anemia ,   Anesthesia Other Findings Day of surgery medications reviewed with patient.  Reproductive/Obstetrics (+) Pregnancy (Hx of C/S x2)                            Anesthesia Physical Anesthesia Plan  ASA: 3  Anesthesia Plan: Spinal   Post-op Pain Management:    Induction:   PONV Risk Score and Plan: 4 or greater and Treatment may vary due to age or medical condition, Ondansetron and Dexamethasone  Airway Management Planned: Natural Airway  Additional Equipment:   Intra-op Plan:   Post-operative Plan:   Informed Consent: I have reviewed the patients History and Physical, chart, labs and discussed the procedure including the risks, benefits and alternatives for the proposed anesthesia with the patient or authorized representative who has indicated his/her understanding and acceptance.       Plan Discussed with: CRNA  Anesthesia Plan Comments:        Anesthesia Quick Evaluation

## 2021-12-16 ENCOUNTER — Inpatient Hospital Stay (HOSPITAL_COMMUNITY): Payer: 59 | Admitting: Anesthesiology

## 2021-12-16 ENCOUNTER — Inpatient Hospital Stay (HOSPITAL_COMMUNITY)
Admission: AD | Admit: 2021-12-16 | Discharge: 2021-12-19 | DRG: 787 | Disposition: A | Discharge status: Home / Self Care | Payer: 59 | Attending: Obstetrics and Gynecology | Admitting: Obstetrics and Gynecology

## 2021-12-16 ENCOUNTER — Inpatient Hospital Stay (HOSPITAL_COMMUNITY): Admission: RE | Admit: 2021-12-16 | Payer: 59 | Source: Home / Self Care | Admitting: Obstetrics and Gynecology

## 2021-12-16 ENCOUNTER — Encounter (HOSPITAL_COMMUNITY): Payer: Self-pay | Admitting: Obstetrics and Gynecology

## 2021-12-16 ENCOUNTER — Encounter (HOSPITAL_COMMUNITY)
Admission: AD | Disposition: A | Discharge status: Home / Self Care | Payer: Self-pay | Source: Home / Self Care | Attending: Obstetrics and Gynecology

## 2021-12-16 DIAGNOSIS — Z8616 Personal history of COVID-19: Secondary | ICD-10-CM

## 2021-12-16 DIAGNOSIS — Z98891 History of uterine scar from previous surgery: Secondary | ICD-10-CM

## 2021-12-16 DIAGNOSIS — O9963 Diseases of the digestive system complicating the puerperium: Secondary | ICD-10-CM | POA: Diagnosis not present

## 2021-12-16 DIAGNOSIS — K567 Ileus, unspecified: Secondary | ICD-10-CM | POA: Diagnosis not present

## 2021-12-16 DIAGNOSIS — O34211 Maternal care for low transverse scar from previous cesarean delivery: Secondary | ICD-10-CM

## 2021-12-16 DIAGNOSIS — K9189 Other postprocedural complications and disorders of digestive system: Secondary | ICD-10-CM | POA: Diagnosis not present

## 2021-12-16 DIAGNOSIS — Z3A39 39 weeks gestation of pregnancy: Secondary | ICD-10-CM

## 2021-12-16 DIAGNOSIS — O9902 Anemia complicating childbirth: Secondary | ICD-10-CM

## 2021-12-16 SURGERY — Surgical Case
Anesthesia: Spinal

## 2021-12-16 MED ORDER — DIPHENHYDRAMINE HCL 50 MG/ML IJ SOLN
12.5000 mg | INTRAMUSCULAR | Status: DC | PRN
  Administered 2021-12-16: 12.5 mg via INTRAVENOUS

## 2021-12-16 MED ORDER — ONDANSETRON HCL 4 MG/2ML IJ SOLN
INTRAMUSCULAR | Status: AC
  Filled 2021-12-16: qty 2

## 2021-12-16 MED ORDER — SOD CITRATE-CITRIC ACID 500-334 MG/5ML PO SOLN
30.0000 mL | ORAL | Status: AC
  Administered 2021-12-16: 30 mL via ORAL

## 2021-12-16 MED ORDER — ACETAMINOPHEN 160 MG/5ML PO SOLN: 1000.0000 mg | Freq: Once | ORAL | Status: DC

## 2021-12-16 MED ORDER — ZOLPIDEM TARTRATE 5 MG PO TABS: 5.0000 mg | ORAL_TABLET | Freq: Every evening | ORAL | Status: DC | PRN

## 2021-12-16 MED ORDER — BUPIVACAINE IN DEXTROSE 0.75-8.25 % IT SOLN
INTRATHECAL | Status: DC | PRN
  Administered 2021-12-16: 1.7 mL via INTRATHECAL

## 2021-12-16 MED ORDER — FENTANYL CITRATE (PF) 100 MCG/2ML IJ SOLN
INTRAMUSCULAR | Status: DC | PRN
  Administered 2021-12-16: 15 ug via INTRATHECAL

## 2021-12-16 MED ORDER — KETOROLAC TROMETHAMINE 30 MG/ML IJ SOLN: 30.0000 mg | Freq: Four times a day (QID) | INTRAMUSCULAR | Status: DC | PRN

## 2021-12-16 MED ORDER — DROPERIDOL 2.5 MG/ML IJ SOLN: 0.6250 mg | Freq: Once | INTRAMUSCULAR | Status: DC | PRN

## 2021-12-16 MED ORDER — ONDANSETRON HCL 4 MG/2ML IJ SOLN
4.0000 mg | Freq: Three times a day (TID) | INTRAMUSCULAR | Status: DC | PRN
  Administered 2021-12-18: 4 mg via INTRAVENOUS
  Filled 2021-12-16: qty 2

## 2021-12-16 MED ORDER — ACETAMINOPHEN 500 MG PO TABS
1000.0000 mg | ORAL_TABLET | Freq: Four times a day (QID) | ORAL | Status: DC
  Administered 2021-12-16 – 2021-12-19 (×11): 1000 mg via ORAL
  Filled 2021-12-16 (×12): qty 2

## 2021-12-16 MED ORDER — LACTATED RINGERS IV BOLUS
500.0000 mL | Freq: Once | INTRAVENOUS | Status: AC
  Administered 2021-12-16: 500 mL via INTRAVENOUS

## 2021-12-16 MED ORDER — POVIDONE-IODINE 10 % EX SWAB: 2.0000 | Freq: Once | CUTANEOUS | Status: DC

## 2021-12-16 MED ORDER — SIMETHICONE 80 MG PO CHEW
80.0000 mg | CHEWABLE_TABLET | Freq: Three times a day (TID) | ORAL | Status: DC
  Administered 2021-12-16 – 2021-12-19 (×8): 80 mg via ORAL
  Filled 2021-12-16 (×8): qty 1

## 2021-12-16 MED ORDER — PRENATAL MULTIVITAMIN CH
1.0000 | ORAL_TABLET | Freq: Every day | ORAL | Status: DC
  Administered 2021-12-17 – 2021-12-19 (×3): 1 via ORAL
  Filled 2021-12-16 (×3): qty 1

## 2021-12-16 MED ORDER — CEFAZOLIN SODIUM-DEXTROSE 2-4 GM/100ML-% IV SOLN
2.0000 g | INTRAVENOUS | Status: AC
  Administered 2021-12-16: 2 g via INTRAVENOUS

## 2021-12-16 MED ORDER — PHENYLEPHRINE HCL-NACL 20-0.9 MG/250ML-% IV SOLN
INTRAVENOUS | Status: AC
  Filled 2021-12-16: qty 250

## 2021-12-16 MED ORDER — LACTATED RINGERS IV SOLN: INTRAVENOUS | Status: DC

## 2021-12-16 MED ORDER — FENTANYL CITRATE (PF) 100 MCG/2ML IJ SOLN: 25.0000 ug | INTRAMUSCULAR | Status: DC | PRN

## 2021-12-16 MED ORDER — SERTRALINE HCL 50 MG PO TABS
75.0000 mg | ORAL_TABLET | Freq: Every day | ORAL | Status: DC
  Administered 2021-12-16 – 2021-12-18 (×3): 75 mg via ORAL
  Filled 2021-12-16 (×5): qty 1

## 2021-12-16 MED ORDER — OXYTOCIN-SODIUM CHLORIDE 30-0.9 UT/500ML-% IV SOLN
INTRAVENOUS | Status: DC | PRN
  Administered 2021-12-16: 200 mL via INTRAVENOUS

## 2021-12-16 MED ORDER — NALOXONE HCL 4 MG/10ML IJ SOLN: 1.0000 ug/kg/h | INTRAVENOUS | Status: DC | PRN

## 2021-12-16 MED ORDER — KETOROLAC TROMETHAMINE 30 MG/ML IJ SOLN: 30.0000 mg | Freq: Once | INTRAMUSCULAR | Status: DC

## 2021-12-16 MED ORDER — OXYTOCIN-SODIUM CHLORIDE 30-0.9 UT/500ML-% IV SOLN
2.5000 [IU]/h | INTRAVENOUS | Status: AC
  Administered 2021-12-16: 2.5 [IU]/h via INTRAVENOUS
  Filled 2021-12-16: qty 500

## 2021-12-16 MED ORDER — MORPHINE SULFATE (PF) 0.5 MG/ML IJ SOLN
INTRAMUSCULAR | Status: AC
  Filled 2021-12-16: qty 10

## 2021-12-16 MED ORDER — ACETAMINOPHEN 10 MG/ML IV SOLN
INTRAVENOUS | Status: DC | PRN
  Administered 2021-12-16: 1000 mg via INTRAVENOUS

## 2021-12-16 MED ORDER — SENNOSIDES-DOCUSATE SODIUM 8.6-50 MG PO TABS
2.0000 | ORAL_TABLET | Freq: Every day | ORAL | Status: DC
  Administered 2021-12-17 – 2021-12-19 (×3): 2 via ORAL
  Filled 2021-12-16 (×3): qty 2

## 2021-12-16 MED ORDER — MORPHINE SULFATE (PF) 0.5 MG/ML IJ SOLN
INTRAMUSCULAR | Status: DC | PRN
  Administered 2021-12-16: 150 ug via INTRATHECAL

## 2021-12-16 MED ORDER — DEXAMETHASONE SODIUM PHOSPHATE 10 MG/ML IJ SOLN
INTRAMUSCULAR | Status: AC
  Filled 2021-12-16: qty 1

## 2021-12-16 MED ORDER — ACETAMINOPHEN 500 MG PO TABS: 1000.0000 mg | ORAL_TABLET | Freq: Once | ORAL | Status: DC

## 2021-12-16 MED ORDER — SODIUM CHLORIDE 0.9 % IR SOLN
Status: DC | PRN
  Administered 2021-12-16: 1000 mL

## 2021-12-16 MED ORDER — TETANUS-DIPHTH-ACELL PERTUSSIS 5-2.5-18.5 LF-MCG/0.5 IM SUSY: 0.5000 mL | PREFILLED_SYRINGE | Freq: Once | INTRAMUSCULAR | Status: DC

## 2021-12-16 MED ORDER — HYDROMORPHONE HCL 1 MG/ML IJ SOLN
0.2000 mg | INTRAMUSCULAR | Status: DC | PRN
  Administered 2021-12-17: 0.6 mg via INTRAVENOUS
  Filled 2021-12-16: qty 1

## 2021-12-16 MED ORDER — LACTATED RINGERS IV SOLN: INTRAVENOUS | Status: DC | PRN

## 2021-12-16 MED ORDER — OXYCODONE HCL 5 MG PO TABS
5.0000 mg | ORAL_TABLET | ORAL | Status: DC | PRN
  Administered 2021-12-17: 10 mg via ORAL
  Administered 2021-12-17: 5 mg via ORAL
  Filled 2021-12-16: qty 1
  Filled 2021-12-16: qty 2

## 2021-12-16 MED ORDER — PHENYLEPHRINE HCL-NACL 20-0.9 MG/250ML-% IV SOLN
INTRAVENOUS | Status: DC | PRN
  Administered 2021-12-16: 30 ug/min via INTRAVENOUS

## 2021-12-16 MED ORDER — DIPHENHYDRAMINE HCL 25 MG PO CAPS: 25.0000 mg | ORAL_CAPSULE | ORAL | Status: DC | PRN

## 2021-12-16 MED ORDER — MENTHOL 3 MG MT LOZG: 1.0000 | LOZENGE | OROMUCOSAL | Status: DC | PRN

## 2021-12-16 MED ORDER — IBUPROFEN 600 MG PO TABS
600.0000 mg | ORAL_TABLET | Freq: Four times a day (QID) | ORAL | Status: DC | PRN
  Administered 2021-12-16 – 2021-12-19 (×10): 600 mg via ORAL
  Filled 2021-12-16 (×10): qty 1

## 2021-12-16 MED ORDER — SIMETHICONE 80 MG PO CHEW
80.0000 mg | CHEWABLE_TABLET | ORAL | Status: DC | PRN
  Administered 2021-12-17: 80 mg via ORAL
  Filled 2021-12-16: qty 1

## 2021-12-16 MED ORDER — STERILE WATER FOR IRRIGATION IR SOLN
Status: DC | PRN
  Administered 2021-12-16: 1000 mL

## 2021-12-16 MED ORDER — FENTANYL CITRATE (PF) 100 MCG/2ML IJ SOLN
INTRAMUSCULAR | Status: AC
  Filled 2021-12-16: qty 2

## 2021-12-16 MED ORDER — DIPHENHYDRAMINE HCL 50 MG/ML IJ SOLN
INTRAMUSCULAR | Status: AC
  Filled 2021-12-16: qty 1

## 2021-12-16 MED ORDER — ACETAMINOPHEN 500 MG PO TABS
1000.0000 mg | ORAL_TABLET | Freq: Four times a day (QID) | ORAL | Status: AC
  Administered 2021-12-16: 1000 mg via ORAL
  Filled 2021-12-16: qty 2

## 2021-12-16 MED ORDER — CEFAZOLIN SODIUM-DEXTROSE 2-4 GM/100ML-% IV SOLN
INTRAVENOUS | Status: AC
  Filled 2021-12-16: qty 100

## 2021-12-16 MED ORDER — DEXAMETHASONE SODIUM PHOSPHATE 10 MG/ML IJ SOLN
INTRAMUSCULAR | Status: DC | PRN
  Administered 2021-12-16: 10 mg via INTRAVENOUS

## 2021-12-16 MED ORDER — SODIUM CHLORIDE 0.9% FLUSH: 3.0000 mL | INTRAVENOUS | Status: DC | PRN

## 2021-12-16 MED ORDER — COCONUT OIL OIL: 1.0000 | TOPICAL_OIL | Status: DC | PRN

## 2021-12-16 MED ORDER — KETOROLAC TROMETHAMINE 30 MG/ML IJ SOLN
30.0000 mg | Freq: Once | INTRAMUSCULAR | Status: AC
  Administered 2021-12-16: 30 mg via INTRAVENOUS
  Filled 2021-12-16: qty 1

## 2021-12-16 MED ORDER — NALOXONE HCL 0.4 MG/ML IJ SOLN: 0.4000 mg | INTRAMUSCULAR | Status: DC | PRN

## 2021-12-16 MED ORDER — SOD CITRATE-CITRIC ACID 500-334 MG/5ML PO SOLN
ORAL | Status: AC
  Filled 2021-12-16: qty 30

## 2021-12-16 MED ORDER — ONDANSETRON HCL 4 MG/2ML IJ SOLN
INTRAMUSCULAR | Status: DC | PRN
  Administered 2021-12-16: 4 mg via INTRAVENOUS

## 2021-12-16 MED ORDER — DIPHENHYDRAMINE HCL 25 MG PO CAPS: 25.0000 mg | ORAL_CAPSULE | Freq: Four times a day (QID) | ORAL | Status: DC | PRN

## 2021-12-16 SURGICAL SUPPLY — 33 items
BENZOIN TINCTURE PRP APPL 2/3 (GAUZE/BANDAGES/DRESSINGS) IMPLANT
CHLORAPREP W/TINT 26ML (MISCELLANEOUS) ×2 IMPLANT
CLAMP UMBILICAL CORD (MISCELLANEOUS) ×1 IMPLANT
CLOTH BEACON ORANGE TIMEOUT ST (SAFETY) ×1 IMPLANT
DERMABOND ADVANCED .7 DNX12 (GAUZE/BANDAGES/DRESSINGS) IMPLANT
DRSG OPSITE POSTOP 4X10 (GAUZE/BANDAGES/DRESSINGS) ×1 IMPLANT
ELECT REM PT RETURN 9FT ADLT (ELECTROSURGICAL) ×1
ELECTRODE REM PT RTRN 9FT ADLT (ELECTROSURGICAL) ×1 IMPLANT
EXTRACTOR VACUUM M CUP 4 TUBE (SUCTIONS) IMPLANT
GAUZE SPONGE 4X4 12PLY STRL LF (GAUZE/BANDAGES/DRESSINGS) IMPLANT
GLOVE BIO SURGEON STRL SZ7.5 (GLOVE) ×1 IMPLANT
GLOVE BIOGEL PI IND STRL 7.0 (GLOVE) ×1 IMPLANT
GOWN STRL REUS W/TWL LRG LVL3 (GOWN DISPOSABLE) ×2 IMPLANT
KIT ABG SYR 3ML LUER SLIP (SYRINGE) ×1 IMPLANT
NDL HYPO 25X5/8 SAFETYGLIDE (NEEDLE) ×1 IMPLANT
NEEDLE HYPO 25X5/8 SAFETYGLIDE (NEEDLE) ×1 IMPLANT
NS IRRIG 1000ML POUR BTL (IV SOLUTION) ×1 IMPLANT
PACK C SECTION WH (CUSTOM PROCEDURE TRAY) ×1 IMPLANT
PAD ABD 7.5X8 STRL (GAUZE/BANDAGES/DRESSINGS) IMPLANT
PAD OB MATERNITY 4.3X12.25 (PERSONAL CARE ITEMS) ×1 IMPLANT
RETAINER VISCERAL (MISCELLANEOUS) IMPLANT
STRIP CLOSURE SKIN 1/2X4 (GAUZE/BANDAGES/DRESSINGS) IMPLANT
SUT MNCRL 0 VIOLET CTX 36 (SUTURE) ×4 IMPLANT
SUT MONOCRYL 0 CTX 36 (SUTURE) ×4
SUT PDS AB 0 CTX 60 (SUTURE) ×1 IMPLANT
SUT PLAIN 0 NONE (SUTURE) IMPLANT
SUT PLAIN 2 0 (SUTURE) ×1
SUT PLAIN 2 0 XLH (SUTURE) IMPLANT
SUT PLAIN ABS 2-0 CT1 27XMFL (SUTURE) IMPLANT
SUT VIC AB 4-0 KS 27 (SUTURE) ×1 IMPLANT
TOWEL OR 17X24 6PK STRL BLUE (TOWEL DISPOSABLE) ×1 IMPLANT
TRAY FOLEY W/BAG SLVR 14FR LF (SET/KITS/TRAYS/PACK) ×1 IMPLANT
WATER STERILE IRR 1000ML POUR (IV SOLUTION) ×1 IMPLANT

## 2021-12-16 NOTE — Progress Notes (Signed)
No changes to H&P per patient history Reviewed procedure-repeat cesarean section Risks discussed including infection, organ damage, bleeding/transfusion-HIV/Hep, DVT/PE, pneumonia, wound breakdown. She states she understands and agrees.

## 2021-12-16 NOTE — Brief Op Note (Signed)
12/16/2021  8:50 AM  PATIENT:  Taylor Choi  36 y.o. female  PRE-OPERATIVE DIAGNOSIS:  previous cesarean section  POST-OPERATIVE DIAGNOSIS:  previous cesarean section  PROCEDURE:  Procedure(s): CESAREAN SECTION (N/A)  SURGEON:  Surgeon(s) and Role:    * Everlene Farrier, MD - Primary  PHYSICIAN ASSISTANT:   ASSISTANTS: none   ANESTHESIA:   spinal  EBL:  200 mL   BLOOD ADMINISTERED:none  DRAINS: Urinary Catheter (Foley)   LOCAL MEDICATIONS USED:  NONE SPECIMEN:  No Specimen  DISPOSITION OF SPECIMEN:  N/A  COUNTS:  YES  TOURNIQUET:  * No tourniquets in log *  DICTATION: .Other Dictation: Dictation Number [id number]  PLAN OF CARE: Admit to inpatient   PATIENT DISPOSITION:  PACU - hemodynamically stable.   Delay start of Pharmacological VTE agent (>24hrs) due to surgical blood loss or risk of bleeding: not applicable

## 2021-12-16 NOTE — Anesthesia Postprocedure Evaluation (Signed)
Anesthesia Post Note  Patient: Taylor Choi  Procedure(s) Performed: CESAREAN SECTION     Patient location during evaluation: PACU Anesthesia Type: Spinal Level of consciousness: awake and alert Pain management: pain level controlled Vital Signs Assessment: post-procedure vital signs reviewed and stable Respiratory status: spontaneous breathing, nonlabored ventilation and respiratory function stable Cardiovascular status: blood pressure returned to baseline Postop Assessment: no apparent nausea or vomiting, spinal receding, no headache and no backache Anesthetic complications: no   No notable events documented.  Last Vitals:  Vitals:   12/16/21 0945 12/16/21 0952  BP: 115/70   Pulse: 75 74  Resp: 10 16  Temp:  36.6 C  SpO2: 97% 98%    Last Pain:  Vitals:   12/16/21 0952  TempSrc: Axillary  PainSc: 0-No pain   Pain Goal:    LLE Motor Response: Purposeful movement (12/16/21 0952) LLE Sensation: Decreased (12/16/21 0952) RLE Motor Response: Purposeful movement (12/16/21 0952) RLE Sensation: Full sensation (12/16/21 2878)     Epidural/Spinal Function Cutaneous sensation: Normal sensation (12/16/21 0952), Patient able to flex knees: Yes (12/16/21 0952), Patient able to lift hips off bed: No (12/16/21 0952), Back pain beyond tenderness at insertion site: No (12/16/21 0952), Progressively worsening motor and/or sensory loss: No (12/16/21 0952), Bowel and/or bladder incontinence post epidural: No (12/16/21 0952)  Marthenia Rolling

## 2021-12-16 NOTE — Transfer of Care (Signed)
Immediate Anesthesia Transfer of Care Note  Patient: Taylor Choi  Procedure(s) Performed: CESAREAN SECTION  Patient Location: PACU  Anesthesia Type:Spinal  Level of Consciousness: awake, alert  and oriented  Airway & Oxygen Therapy: Patient Spontanous Breathing  Post-op Assessment: Report given to RN and Post -op Vital signs reviewed and stable  Post vital signs: Reviewed and stable HR 81, RR 16, 97%, BP 125/72  Last Vitals:  Vitals Value Taken Time  BP    Temp    Pulse    Resp    SpO2      Last Pain:  Vitals:   12/16/21 0608  TempSrc: Oral  PainSc: 0-No pain         Complications: No notable events documented.

## 2021-12-16 NOTE — Op Note (Signed)
Taylor, Choi MEDICAL RECORD NO: [id number] ACCOUNT NO: [id number] DATE OF BIRTH: 11-27-1985 FACILITY: [facility] LOCATION: [facility] PHYSICIAN: Daleen Bo. Lyn Hollingshead, MD  Operative Report   DATE OF PROCEDURE: 12/16/2021  PREOPERATIVE DIAGNOSIS:  Previous cesarean section, desires repeat.  POSTOPERATIVE DIAGNOSIS:  Previous cesarean section, desires repeat.  PROCEDURE:  Repeat low transverse cesarean section.  SURGEON:  Daleen Bo. Lyn Hollingshead, MD  ANESTHESIA:  Spinal.  Collins Scotland, MD  FINDINGS:  Viable female infant, Apgars arterial cord pH, birth weight pending.  ESTIMATED BLOOD LOSS:  273 mL.  INDICATIONS AND CONSENT:  This patient is a 36 year old G3, P2 at 40 and 1/7 weeks with 2 previous cesarean sections.  Repeat cesarean section was discussed along with potential risks and complications including, but not limited to infection, organ  damage, bleeding requiring transfusion of blood products with HIV and hepatitis acquisition, DVT, PE, pneumonia and wound breakdown.  She states she understands and agrees and consent is signed and on the chart.  DESCRIPTION OF PROCEDURE:  The patient was taken to the operating room where she was identified.  Spinal anesthetic was placed per Dr. Daiva Huge.  She was placed in the dorsal supine position with a 15-degree left lateral wedge.  She was prepped vaginally  with Betadine.  Foley catheter was placed and prepped abdominally with ChloraPrep.  Timeout was done.  After 3-minute drying time, she was draped in a sterile fashion.  After testing for adequate spinal anesthesia, skin was entered through the previous  Pfannenstiel scar.  Dissection was carried out in layers to the peritoneum.  There is some subcutaneous scarring on the way in.  Peritoneum was entered and extended superiorly and inferiorly.  The bladder flap was advanced.  The lower segment is somewhat  thin, although not translucent.  The uterus was incised in a low transverse manner and  the uterine cavity is entered bluntly with a hemostat.  Uterine incision is extended with the fingers.  Artificial rupture of membranes for clear fluid was carried  out.  Baby was delivered from the vertex position without difficulty.  Good cry and tone is noted.  Cord was clamped and cut and the baby was handed to waiting pediatrics team after 1 minute.  Placenta was delivered.  Inspection reveals the uterus to be  clean.  Uterus was closed in a single running locking layer of 0 Monocryl suture.  Additional figure-of-eight was placed and good hemostasis was noted.  Tubes and ovaries were normal bilaterally.  Lavage was done.  Anterior peritoneum was closed in a  running fashion with 0 Monocryl suture, which was also used to reapproximate the pyramidalis muscle in the midline.  Anterior rectus fascia was closed in running fashion with a 0 looped PDS.  Subcutaneous layer was closed with interrupted plain suture  and the skin was closed in a subcuticular fashion with 4-0 Vicryl on a Keith needle.  Benzoin, Steri-Strips and honeycomb dressing and pressure dressing were applied.  All counts were correct.  The patient was taken to recovery room in stable condition.   MUK D: 12/16/2021 8:55:58 am T: 12/16/2021 9:16:00 am  JOB: [id number]/ [id number]

## 2021-12-16 NOTE — Anesthesia Procedure Notes (Signed)
Spinal  Patient location during procedure: OR Start time: 12/16/2021 7:30 AM End time: 12/16/2021 7:36 AM Reason for block: surgical anesthesia Staffing Performed: anesthesiologist  Anesthesiologist: Brennan Bailey, MD Performed by: Brennan Bailey, MD Authorized by: Brennan Bailey, MD   Preanesthetic Checklist Completed: patient identified, IV checked, risks and benefits discussed, monitors and equipment checked, pre-op evaluation and timeout performed Spinal Block Patient position: sitting Prep: DuraPrep and site prepped and draped Patient monitoring: heart rate, continuous pulse ox and blood pressure Approach: midline Location: L3-4 Injection technique: single-shot Needle Needle type: Pencan  Needle gauge: 24 G Needle length: 10 cm Assessment Sensory level: T4 Events: CSF return Additional Notes Risks, benefits, and alternative discussed. Patient gave consent to procedure. Prepped and draped in sitting position. 2 attempts required-suspect lumbar levoscoliosis. Clear CSF obtained. Positive terminal aspiration. No pain or paraesthesias with injection. Patient tolerated procedure well. Vital signs stable. Tawny Asal, MD

## 2021-12-16 NOTE — Lactation Note (Signed)
This note was copied from a baby's chart. Lactation Consultation Note  Patient Name: Taylor Choi AYOKH'T Date: 12/16/2021   Age:36 hours Birth Parent declined [place] services at this time, she will call if she needs LC services (PRN). Maternal Data    Feeding    LATCH Score                    Lactation Tools Discussed/Used    Interventions    Discharge    Consult Status      Vicente Serene 12/16/2021, 5:51 PM

## 2021-12-17 ENCOUNTER — Encounter (HOSPITAL_COMMUNITY): Payer: Self-pay | Admitting: Obstetrics and Gynecology

## 2021-12-17 ENCOUNTER — Other Ambulatory Visit: Payer: Self-pay

## 2021-12-17 LAB — CBC
HCT: 26.6 % — ABNORMAL LOW (ref 36.0–46.0)
Hemoglobin: 9.4 g/dL — ABNORMAL LOW (ref 12.0–15.0)
MCH: 31.4 pg (ref 26.0–34.0)
MCHC: 35.3 g/dL (ref 30.0–36.0)
MCV: 89 fL (ref 80.0–100.0)
Platelets: 198 10*3/uL (ref 150–400)
RBC: 2.99 MIL/uL — ABNORMAL LOW (ref 3.87–5.11)
RDW: 14.1 % (ref 11.5–15.5)
WBC: 11.9 10*3/uL — ABNORMAL HIGH (ref 4.0–10.5)
nRBC: 0 % (ref 0.0–0.2)

## 2021-12-17 MED ORDER — LACTATED RINGERS IV BOLUS
250.0000 mL | Freq: Once | INTRAVENOUS | Status: AC
  Administered 2021-12-17: 250 mL via INTRAVENOUS

## 2021-12-17 NOTE — Progress Notes (Signed)
MOB was referred for history of depression/anxiety. * Referral screened out by Clinical Social Worker because none of the following criteria appear to apply: ~ History of anxiety/depression during this pregnancy, or of post-partum depression following prior delivery. ~ Diagnosis of anxiety and/or depression within last 3 years OR * MOB's symptoms currently being treated with medication and/or therapy. MOB has an active prescription for sertraline 75mg/day. MOB's PNC records indicate MOB is connected with therapist.   Please contact the Clinical Social Worker if needs arise, by MOB request, or if MOB scores greater than 9/yes to question 10 on [place] Postpartum Depression Screen.  Signed,  Suzi Hernan K. Tangela Dolliver, MSW, LCSWA, LCASA 12/17/2021 10:38 AM 

## 2021-12-17 NOTE — Progress Notes (Signed)
Subjective: Postpartum Day 1: Cesarean Delivery Patient reports tolerating PO and + flatus.    Objective: Vital signs in last 24 hours: Temp:  [97.9 F (36.6 C)-98.3 F (36.8 C)] 98.3 F (36.8 C) (10/21 0330) Pulse Rate:  [53-86] 53 (10/21 0330) Resp:  [10-21] 18 (10/21 0330) BP: (105-125)/(67-76) 105/68 (10/21 0330) SpO2:  [90 %-100 %] 100 % (10/21 0330)  Physical Exam:  General: alert, cooperative, and no distress Lochia: appropriate Uterine Fundus: firm Incision: healing well DVT Evaluation: No evidence of DVT seen on physical exam.  Recent Labs    12/14/21 0902 12/17/21 0520  HGB 11.4* 9.4*  HCT 33.0* 26.6*    Assessment/Plan: Status post Cesarean section. Doing well postoperatively.  Continue current care. D/W circumcision of newborn boy baby. Risks reviewed. She states she understands and agrees. Shon Millet II, MD 12/17/2021, 6:54 AM

## 2021-12-18 LAB — COMPREHENSIVE METABOLIC PANEL
ALT: 20 U/L (ref 0–44)
AST: 27 U/L (ref 15–41)
Albumin: 2.3 g/dL — ABNORMAL LOW (ref 3.5–5.0)
Alkaline Phosphatase: 104 U/L (ref 38–126)
Anion gap: 5 (ref 5–15)
BUN: 7 mg/dL (ref 6–20)
CO2: 24 mmol/L (ref 22–32)
Calcium: 8.3 mg/dL — ABNORMAL LOW (ref 8.9–10.3)
Chloride: 108 mmol/L (ref 98–111)
Creatinine, Ser: 0.67 mg/dL (ref 0.44–1.00)
GFR, Estimated: >60 mL/min (ref >60)
Glucose, Bld: 97 mg/dL (ref 70–99)
Potassium: 4.2 mmol/L (ref 3.5–5.1)
Sodium: 137 mmol/L (ref 135–145)
Total Bilirubin: 0.4 mg/dL (ref 0.3–1.2)
Total Protein: 5 g/dL — ABNORMAL LOW (ref 6.5–8.1)

## 2021-12-18 LAB — CBC
HCT: 31.8 % — ABNORMAL LOW (ref 36.0–46.0)
Hemoglobin: 10.6 g/dL — ABNORMAL LOW (ref 12.0–15.0)
MCH: 30.5 pg (ref 26.0–34.0)
MCHC: 33.3 g/dL (ref 30.0–36.0)
MCV: 91.4 fL (ref 80.0–100.0)
Platelets: 205 10*3/uL (ref 150–400)
RBC: 3.48 MIL/uL — ABNORMAL LOW (ref 3.87–5.11)
RDW: 14.4 % (ref 11.5–15.5)
WBC: 7.9 10*3/uL (ref 4.0–10.5)
nRBC: 0 % (ref 0.0–0.2)

## 2021-12-18 MED ORDER — LACTATED RINGERS IV SOLN: INTRAVENOUS | Status: DC

## 2021-12-18 MED ORDER — BISACODYL 10 MG RE SUPP
10.0000 mg | Freq: Once | RECTAL | Status: AC
  Administered 2021-12-18: 10 mg via RECTAL
  Filled 2021-12-18: qty 1

## 2021-12-18 NOTE — Lactation Note (Signed)
This note was copied from a baby's chart. Lactation Consultation Note  Patient Name: Taylor Choi YSHUO'H Date: 12/18/2021   Age:36 hours Mom requested Lactation. LC went to see mom. Mom stated she had just finished BF. Asked mom to call for next feeding for LC. Mom stated she just wanted me to check latch for her.  Maternal Data    Feeding    LATCH Score                    Lactation Tools Discussed/Used    Interventions    Discharge    Consult Status      Theodoro Kalata 12/18/2021, 7:53 PM

## 2021-12-18 NOTE — Progress Notes (Signed)
Feeling much better, passing flatus and had a BM, tolerating liquids  Today's Vitals   12/18/21 0657 12/18/21 0809 12/18/21 1005 12/18/21 1227  BP:      Pulse:      Resp:      Temp:      TempSrc:      SpO2:      Weight:      Height:      PainSc: 5  5  5  4     Body mass index is 28.97 kg/m.  Abdomen-soft, NT, not distended, BS normal Dressing C&D  Results for orders placed or performed during the hospital encounter of 12/16/21 (from the past 24 hour(s))  CBC     Status: Abnormal   Collection Time: 12/18/21  6:56 AM  Result Value Ref Range   WBC 7.9 4.0 - 10.5 K/uL   RBC 3.48 (L) 3.87 - 5.11 MIL/uL   Hemoglobin 10.6 (L) 12.0 - 15.0 g/dL   HCT 31.8 (L) 36.0 - 46.0 %   MCV 91.4 80.0 - 100.0 fL   MCH 30.5 26.0 - 34.0 pg   MCHC 33.3 30.0 - 36.0 g/dL   RDW 14.4 11.5 - 15.5 %   Platelets 205 150 - 400 K/uL   nRBC 0.0 0.0 - 0.2 %  Comprehensive metabolic panel     Status: Abnormal   Collection Time: 12/18/21  6:56 AM  Result Value Ref Range   Sodium 137 135 - 145 mmol/L   Potassium 4.2 3.5 - 5.1 mmol/L   Chloride 108 98 - 111 mmol/L   CO2 24 22 - 32 mmol/L   Glucose, Bld 97 70 - 99 mg/dL   BUN 7 6 - 20 mg/dL   Creatinine, Ser 0.67 0.44 - 1.00 mg/dL   Calcium 8.3 (L) 8.9 - 10.3 mg/dL   Total Protein 5.0 (L) 6.5 - 8.1 g/dL   Albumin 2.3 (L) 3.5 - 5.0 g/dL   AST 27 15 - 41 U/L   ALT 20 0 - 44 U/L   Alkaline Phosphatase 104 38 - 126 U/L   Total Bilirubin 0.4 0.3 - 1.2 mg/dL   GFR, Estimated >60 >60 mL/min   Anion gap 5 5 - 15     A/P: ileus resolving         Continue IV fluids         Advance to soft diet

## 2021-12-18 NOTE — Progress Notes (Signed)
Tolerated regular diet yesterday. Had vomiting x 1 this morning and C/O gas pains. Incisional pain appropriate. Lochia light.  Today's Vitals   12/17/21 2048 12/17/21 2236 12/17/21 2330 12/18/21 0557  BP: 114/79   (!) 142/82  Pulse: 67   72  Resp: 18   18  Temp: 98.1 F (36.7 C)   98.1 F (36.7 C)  TempSrc: Oral   Oral  SpO2: 100%   100%  Weight:      Height:      PainSc: 0-No pain 9  6     Body mass index is 28.97 kg/m.   Abdomen-soft, mild distension with good BS, honeycomb dressing C&D  Results for orders placed or performed during the hospital encounter of 12/16/21 (from the past 72 hour(s))  CBC     Status: Abnormal   Collection Time: 12/17/21  5:20 AM  Result Value Ref Range   WBC 11.9 (H) 4.0 - 10.5 K/uL   RBC 2.99 (L) 3.87 - 5.11 MIL/uL   Hemoglobin 9.4 (L) 12.0 - 15.0 g/dL   HCT 26.6 (L) 36.0 - 46.0 %   MCV 89.0 80.0 - 100.0 fL   MCH 31.4 26.0 - 34.0 pg   MCHC 35.3 30.0 - 36.0 g/dL   RDW 14.1 11.5 - 15.5 %   Platelets 198 150 - 400 K/uL   nRBC 0.0 0.0 - 0.2 %    Comment: Performed at [facility], [place] [address] [place], [place] [address]    A/P: mild PO ileus         IV fluids, CBC/CMET, clear liquids, Dulcolax supp         D/W patient

## 2021-12-19 MED ORDER — DOCUSATE SODIUM 100 MG PO CAPS: 100.0000 mg | ORAL_CAPSULE | Freq: Two times a day (BID) | ORAL | 0 refills | Status: AC

## 2021-12-19 MED ORDER — OXYCODONE HCL 5 MG PO TABS: 5.0000 mg | ORAL_TABLET | ORAL | 0 refills | Status: AC | PRN

## 2021-12-19 MED ORDER — ACETAMINOPHEN 325 MG PO TABS: 650.0000 mg | ORAL_TABLET | Freq: Four times a day (QID) | ORAL | 0 refills | Status: AC | PRN

## 2021-12-19 MED ORDER — IBUPROFEN 600 MG PO TABS: 600.0000 mg | ORAL_TABLET | Freq: Four times a day (QID) | ORAL | 0 refills | Status: AC | PRN

## 2021-12-19 NOTE — Lactation Note (Signed)
This note was copied from a baby's chart. Lactation Consultation Note  Patient Name: [name] Date: 12/19/2021 Reason for consult: Follow-up assessment;Infant weight loss;Term (7 % weight loss, LC reviewed the doc flow sheets with mom/ WNL. LC reviewed BF D/C teaching and resources.) Age:36 hours  Maternal Data    Feeding Mother's Current Feeding Choice: Breast Milk and Formula  LATCH Score - mom aware to feed the baby   Lactation Tools Discussed/Used    Interventions PP education     Discharge Discharge Education: Engorgement and breast care Pump: DEBP;Personal  Consult Status Consult Status: Complete Date: 12/19/21    Myer Haff 12/19/2021, 9:55 AM

## 2021-12-19 NOTE — Progress Notes (Addendum)
Postpartum Progress Note  Postpartum Day 2 s/p repeat Cesarean section.  Subjective:  Patient reports no overnight events.  She was treated with dulcolax and liquid diet for developing ileus.  She has been passing flatus and had a BM since.  She feels much better and is tolerating PO without nausea. She reports well controlled pain, ambulating without difficulty, voiding spontaneously.  Vaginal bleeding is minimal.  Objective: Blood pressure (!) 134/90, pulse 73, temperature 98.4 F (36.9 C), temperature source Oral, resp. rate 18, height 5\' 9"  (1.753 m), weight 89 kg, SpO2 98 %, unknown if currently breastfeeding.  Physical Exam:  General: alert and no distress Lochia: appropriate Uterine Fundus: firm Incision: clean/dry/intact Abdomen: soft, nondistended, ATTP DVT Evaluation: No evidence of DVT seen on physical exam.  Recent Labs    12/17/21 0520 12/18/21 0656  HGB 9.4* 10.6*  HCT 26.6* 31.8*    Assessment/Plan: Postpartum Day 3, s/p C-section Mild ileus - resolved, passing flatus and BM after dulcolax and tolerating PO without nausea.  Precautions reviewed. Lactation following Mild range elevated BP.  [place] labs wnl.  Plan for BP check in office. Doing well, continue routine postpartum care. Anticipate discharge today.    LOS: 3 days   Carlyon Shadow 12/19/2021, 9:16 AM

## 2021-12-20 NOTE — Discharge Summary (Signed)
Obstetric Discharge Summary  Taylor Choi is a 36 y.o. female that presented on 12/16/2021 for scheduled repeat C section  She delivered a viable female infant on 12/16/21.  Her postpartum course was notable for mild ileus on PPD#2, which was resolved with conservative PO intake and bowel regimen. On PPD#3, she reported well controlled pain, spontaneous voiding, ambulating without difficulty, and tolerating PO.  She was stable for discharge home on 12/19/2021 with plans for in-office follow up.  Hemoglobin  Date Value Ref Range Status  12/18/2021 10.6 (L) 12.0 - 15.0 g/dL Final   HCT  Date Value Ref Range Status  12/18/2021 31.8 (L) 36.0 - 46.0 % Final    Physical Exam:  General: alert and no distress Lochia: appropriate Uterine Fundus: firm Incision: healing well DVT Evaluation: No evidence of DVT seen on physical exam.  Discharge Diagnoses: Term Pregnancy-delivered  Discharge Information: Date: 12/20/2021 Activity: Pelvic rest, as tolerated Diet: routine Medications: Tylenol, motrin, oxycodone, colace Condition: stable Instructions: Refer to practice specific booklet.  Discussed prior to discharge.  Discharge to: [place] Follow up.   Why: Please follow up for a 1 week blood pressure check and a 6 week postpartum visit. Contact information: [place] [address] [phone]                 Newborn Data: Live born female  Birth Weight: 7 lb 12.5 oz (3530 g) APGAR: 62, 9  Newborn Delivery   Birth date/time: 12/16/2021 08:04:00 Delivery type: C-Section, Low Transverse Trial of labor: No C-section categorization: Repeat      Discussed incision care and removal of honeycomb PPD#5 and steri strips PPD#7.   Home with mother.  Carlyon Shadow 12/20/2021, 1:06 PM

## 2021-12-29 ENCOUNTER — Telehealth (HOSPITAL_COMMUNITY): Payer: Self-pay

## 2021-12-29 NOTE — Telephone Encounter (Signed)
Patient reports feeling good. "My incision is doing good. I had a blood pressure check last week at the office and they looked at my incision and they said it's looking great." Patient declines questions/concerns about her health and healing.  Patient reports that baby is doing well. Eating, peeing/pooping, and gaining weight well. Baby sleeps in a bassinet. RN reviewed ABC's of safe sleep with patient. Patient declines any questions or concerns about baby.  EPDS score is 5.  Sharyn Lull [facility] 12/29/21,1237

## 2022-01-05 ENCOUNTER — Emergency Department (HOSPITAL_BASED_OUTPATIENT_CLINIC_OR_DEPARTMENT_OTHER): Payer: 59 | Admitting: Radiology

## 2022-01-05 ENCOUNTER — Emergency Department (HOSPITAL_BASED_OUTPATIENT_CLINIC_OR_DEPARTMENT_OTHER): Payer: 59

## 2022-01-05 ENCOUNTER — Emergency Department (HOSPITAL_BASED_OUTPATIENT_CLINIC_OR_DEPARTMENT_OTHER)
Admission: EM | Admit: 2022-01-05 | Discharge: 2022-01-05 | Disposition: A | Discharge status: Home / Self Care | Payer: 59 | Attending: Emergency Medicine | Admitting: Emergency Medicine

## 2022-01-05 ENCOUNTER — Other Ambulatory Visit: Payer: Self-pay

## 2022-01-05 ENCOUNTER — Encounter (HOSPITAL_BASED_OUTPATIENT_CLINIC_OR_DEPARTMENT_OTHER): Payer: Self-pay | Admitting: Emergency Medicine

## 2022-01-05 DIAGNOSIS — R0789 Other chest pain: Secondary | ICD-10-CM | POA: Insufficient documentation

## 2022-01-05 DIAGNOSIS — R1013 Epigastric pain: Secondary | ICD-10-CM | POA: Insufficient documentation

## 2022-01-05 DIAGNOSIS — R059 Cough, unspecified: Secondary | ICD-10-CM | POA: Diagnosis not present

## 2022-01-05 LAB — BASIC METABOLIC PANEL
Anion gap: 9 (ref 5–15)
BUN: 12 mg/dL (ref 6–20)
CO2: 25 mmol/L (ref 22–32)
Calcium: 9.6 mg/dL (ref 8.9–10.3)
Chloride: 104 mmol/L (ref 98–111)
Creatinine, Ser: 0.69 mg/dL (ref 0.44–1.00)
GFR, Estimated: >60 mL/min (ref >60)
Glucose, Bld: 105 mg/dL — ABNORMAL HIGH (ref 70–99)
Potassium: 4 mmol/L (ref 3.5–5.1)
Sodium: 138 mmol/L (ref 135–145)

## 2022-01-05 LAB — CBC
HCT: 45.2 % (ref 36.0–46.0)
Hemoglobin: 14.9 g/dL (ref 12.0–15.0)
MCH: 29 pg (ref 26.0–34.0)
MCHC: 33 g/dL (ref 30.0–36.0)
MCV: 88.1 fL (ref 80.0–100.0)
Platelets: 409 10*3/uL — ABNORMAL HIGH (ref 150–400)
RBC: 5.13 MIL/uL — ABNORMAL HIGH (ref 3.87–5.11)
RDW: 12.5 % (ref 11.5–15.5)
WBC: 8.1 10*3/uL (ref 4.0–10.5)
nRBC: 0 % (ref 0.0–0.2)

## 2022-01-05 LAB — HEPATIC FUNCTION PANEL
ALT: 23 U/L (ref 0–44)
AST: 19 U/L (ref 15–41)
Albumin: 4.5 g/dL (ref 3.5–5.0)
Alkaline Phosphatase: 120 U/L (ref 38–126)
Bilirubin, Direct: <0.1 mg/dL (ref 0.0–0.2)
Total Bilirubin: 0.3 mg/dL (ref 0.3–1.2)
Total Protein: 7.5 g/dL (ref 6.5–8.1)

## 2022-01-05 LAB — TROPONIN I (HIGH SENSITIVITY)
Troponin I (High Sensitivity): <2 ng/L (ref <18)
Troponin I (High Sensitivity): 4 ng/L (ref <18)

## 2022-01-05 LAB — LIPASE, BLOOD: Lipase: 24 U/L (ref 11–51)

## 2022-01-05 MED ORDER — IOHEXOL 350 MG/ML SOLN
100.0000 mL | Freq: Once | INTRAVENOUS | Status: AC | PRN
  Administered 2022-01-05: 75 mL via INTRAVENOUS

## 2022-01-05 MED ORDER — KETOROLAC TROMETHAMINE 15 MG/ML IJ SOLN
15.0000 mg | Freq: Once | INTRAMUSCULAR | Status: AC
  Administered 2022-01-05: 15 mg via INTRAVENOUS
  Filled 2022-01-05: qty 1

## 2022-01-05 NOTE — ED Triage Notes (Signed)
Patient has chest pain under left breast. Reports hard to take a deep breath. [facility] by [facility] who referred to ed to r/o PE. Hx -c-section 2 weeks ago.

## 2022-01-05 NOTE — Discharge Instructions (Signed)
Your laboratories also are within normal limits today.  The CT result of your chest along with your abdomen were within normal limits.  May continue taking anti-inflammatories to help with pain control for the next couple days.  Follow-up with your primary care physician as needed.

## 2022-01-05 NOTE — ED Provider Notes (Signed)
[facility] EMERGENCY DEPT Provider Note   CSN: [id number] Arrival date & time: 01/05/22  1646     History  Chief Complaint  Patient presents with   Chest Pain    Taylor Choi is a 36 y.o. female.  36 y.o female with no PMH presents to the ED with a chief complaint of left-sided chest pain of stabbing nature that began 4 days ago.  Patient had a C-section performed on October 20 when he 23, reports since her delivery she has had some upper respiratory symptoms such as congestion, treated for a sinus infection and completing a "course of azithromycin.  Reports she began to experience pain along the left side of her chest that is been ongoing for the past 4 days.  Pain is scribed as sharp and stabbing radiating under her left breast, exacerbated if she lifts, moves around.  Has tried ibuprofen 600 mg with some improvement in the pain.  She does endorse feeling somewhat winded, when she is coughing she feels like she needs to take a deeper breath.  Pain is also exacerbated with deep breathing.  Does not have any prior history of pulmonary embolisms.  No prior history of cardiac disease, no family history of CAD, non-smoker.  The history is provided by the patient and medical records.  Chest Pain Pain location:  L chest Pain quality: stabbing   Pain radiates to:  Does not radiate Pain severity:  Moderate Duration:  4 days Timing:  Intermittent Progression:  Worsening Chronicity:  New Context: lifting   Context: not drug use, not stress and not trauma   Associated symptoms: no abdominal pain, no fever, no nausea, no shortness of breath and no vomiting        Home Medications Prior to Admission medications   Medication Sig Start Date End Date Taking? Authorizing Provider  acetaminophen (TYLENOL) 325 MG tablet Take 2 tablets (650 mg total) by mouth every 6 (six) hours as needed. 12/19/21   Lyn Henri, MD  cetirizine (ZYRTEC) 10 MG tablet Take 10 mg by  mouth daily.    [provider]  docusate sodium (COLACE) 100 MG capsule Take 1 capsule (100 mg total) by mouth 2 (two) times daily. 12/19/21   Lyn Henri, MD  ibuprofen (ADVIL) 600 MG tablet Take 1 tablet (600 mg total) by mouth every 6 (six) hours as needed for moderate pain. 12/19/21   Lyn Henri, MD  Prenatal Multivit-Min-Fe-FA (PRE-NATAL FORMULA) TABS Take 1 tablet by mouth daily.    [provider]  sertraline (ZOLOFT) 50 MG tablet Take 75 mg by mouth daily. 08/18/21   [provider]      Allergies    Other and Wellbutrin [bupropion]    Review of Systems   Review of Systems  Constitutional:  Negative for chills and fever.  HENT:  Negative for rhinorrhea and sore throat.   Respiratory:  Negative for shortness of breath.   Cardiovascular:  Positive for chest pain.  Gastrointestinal:  Negative for abdominal pain, nausea and vomiting.  All other systems reviewed and are negative.   Physical Exam Updated Vital Signs BP 126/81   Pulse 63   Temp 98.2 F (36.8 C)   Resp 14   Ht 5\' 9"  (1.753 m)   Wt 76.7 kg   SpO2 99%   BMI 24.96 kg/m  Physical Exam Vitals and nursing note reviewed.  Constitutional:      General: She is not in acute distress.  Appearance: She is well-developed.  HENT:     Head: Normocephalic and atraumatic.     Mouth/Throat:     Pharynx: No oropharyngeal exudate.  Eyes:     Pupils: Pupils are equal, round, and reactive to light.  Cardiovascular:     Rate and Rhythm: Regular rhythm.     Heart sounds: Normal heart sounds.  Pulmonary:     Effort: Pulmonary effort is normal. No respiratory distress.     Breath sounds: Normal breath sounds.     Comments: No pain with palpation of the chest. Abdominal:     General: Bowel sounds are normal. There is no distension.     Palpations: Abdomen is soft.     Tenderness: There is abdominal tenderness in the epigastric area. There is guarding and rebound. There is no right  CVA tenderness or left CVA tenderness.     Hernia: A hernia is present. Hernia is present in the umbilical area and ventral area.  Musculoskeletal:        General: No tenderness or deformity.     Cervical back: Normal range of motion.     Right lower leg: No edema.     Left lower leg: No edema.  Skin:    General: Skin is warm and dry.  Neurological:     Mental Status: She is alert and oriented to person, place, and time.     ED Results / Procedures / Treatments   Labs (all labs ordered are listed, but only abnormal results are displayed) Labs Reviewed  BASIC METABOLIC PANEL - Abnormal; Notable for the following components:      Result Value   Glucose, Bld 105 (*)    All other components within normal limits  CBC - Abnormal; Notable for the following components:   RBC 5.13 (*)    Platelets 409 (*)    All other components within normal limits  LIPASE, BLOOD  HEPATIC FUNCTION PANEL  TROPONIN I (HIGH SENSITIVITY)  TROPONIN I (HIGH SENSITIVITY)    EKG EKG Interpretation  Date/Time:  Thursday January 05 2022 16:59:30 EST Ventricular Rate:  81 PR Interval:  140 QRS Duration: 78 QT Interval:  354 QTC Calculation: 411 R Axis:   100 Text Interpretation: Normal sinus rhythm Rightward axis similar to Jun 2015 Confirmed by Pricilla Loveless [phone]) on 01/05/2022 6:08:44 PM  Radiology CT ABDOMEN PELVIS W CONTRAST  Result Date: 01/05/2022 CLINICAL DATA:  Pain under left breast recent C-section EXAM: CT ABDOMEN AND PELVIS WITH CONTRAST TECHNIQUE: Multidetector CT imaging of the abdomen and pelvis was performed using the standard protocol following bolus administration of intravenous contrast. RADIATION DOSE REDUCTION: This exam was performed according to the departmental dose-optimization program which includes automated exposure control, adjustment of the mA and/or kV according to patient size and/or use of iterative reconstruction technique. CONTRAST:  4mL OMNIPAQUE IOHEXOL 350 MG/ML  SOLN COMPARISON:  None Available. FINDINGS: Lower chest: Lung bases are clear. Hepatobiliary: No focal liver abnormality is seen. No gallstones, gallbladder wall thickening, or biliary dilatation. Pancreas: Unremarkable. No pancreatic ductal dilatation or surrounding inflammatory changes. Spleen: Normal in size without focal abnormality. Adrenals/Urinary Tract: Adrenal glands are unremarkable. Kidneys are normal, without renal calculi, focal lesion, or hydronephrosis. Bladder is unremarkable. Stomach/Bowel: Stomach is nonenlarged. There is no dilated small bowel. No acute bowel wall thickening. Midline cecum, positions within the anterior pelvis where there is diastasis of the rectus. No acute bowel wall thickening Vascular/Lymphatic: No significant vascular findings are present. No enlarged abdominal or pelvic  lymph nodes. Reproductive: No adnexal mass. Prominent uterus with hypoechoic fluid or endometrial thickening likely related to recent postpartum status. Small defect at the lower anterior uterine segment corresponding to history of Caesarean section. Other: Negative for pelvic effusion or free air. Anterior abdominal wall laxity with bulging cecum and fat anteriorly but no incarceration or obstruction. Small adjacent heterogenous fat density with tiny fluid could reflect small hernia or area of fat necrosis. Musculoskeletal: No acute osseous abnormality IMPRESSION: 1. No CT evidence for acute intra-abdominal or pelvic abnormality. 2. Anterior abdominal wall laxity with bulging midline cecum and fat anteriorly but no incarceration or obstruction. 3. Prominent uterus with hypoechoic fluid within the endometrial canal versus endometrial thickening likely related to recent postpartum status. Electronically Signed   By: Jasmine Pang M.D.   On: 01/05/2022 19:34   CT Angio Chest PE W and/or Wo Contrast  Result Date: 01/05/2022 CLINICAL DATA:  Chest pain.  Shortness of breath. EXAM: CT ANGIOGRAPHY CHEST WITH  CONTRAST TECHNIQUE: Multidetector CT imaging of the chest was performed using the standard protocol during bolus administration of intravenous contrast. Multiplanar CT image reconstructions and MIPs were obtained to evaluate the vascular anatomy. RADIATION DOSE REDUCTION: This exam was performed according to the departmental dose-optimization program which includes automated exposure control, adjustment of the mA and/or kV according to patient size and/or use of iterative reconstruction technique. CONTRAST:  83mL OMNIPAQUE IOHEXOL 350 MG/ML SOLN COMPARISON:  None Available. FINDINGS: Cardiovascular: Satisfactory opacification of the pulmonary arteries to the segmental level. No evidence of pulmonary embolism. Normal heart size. No pericardial effusion. Mediastinum/Nodes: No enlarged mediastinal, hilar, or axillary lymph nodes. Thyroid gland, trachea, and esophagus demonstrate no significant findings. Lungs/Pleura: No pleural effusion or pneumothorax. There is endobronchial debris in the right lower and right middle lobe with clustered nodularity in the right middle lobe. Upper Abdomen: Abdomen and pelvis for additional findings. See separately dictated CT Musculoskeletal: No chest wall abnormality. No acute or significant osseous findings. Review of the MIP images confirms the above findings. IMPRESSION: 1. No evidence of pulmonary embolism. No pleural effusion. No pneumothorax. 2. Endobronchial debris in the right lower and right middle lobe with clustered nodularity in the right middle lobe. This may be related aspiration or atypical infection. Electronically Signed   By: Lorenza Cambridge M.D.   On: 01/05/2022 19:32   DG Chest 2 View  Result Date: 01/05/2022 CLINICAL DATA:  Chest pain short of breath EXAM: CHEST - 2 VIEW COMPARISON:  08/25/2013 FINDINGS: The heart size and mediastinal contours are within normal limits. Both lungs are clear. Atelectasis or scarring at the right middle lobe. The visualized skeletal  structures are unremarkable. IMPRESSION: No active cardiopulmonary disease. Atelectasis or scarring at the right middle lobe. Electronically Signed   By: Jasmine Pang M.D.   On: 01/05/2022 17:32    Procedures Procedures    Medications Ordered in ED Medications  ketorolac (TORADOL) 15 MG/ML injection 15 mg (has no administration in time range)  iohexol (OMNIPAQUE) 350 MG/ML injection 100 mL (75 mLs Intravenous Contrast Given 01/05/22 1857)    ED Course/ Medical Decision Making/ A&P                           Medical Decision Making Amount and/or Complexity of Data Reviewed Labs: ordered. Radiology: ordered.  Risk Prescription drug management.    This patient presents to the ED for concern of chest pain, this involves a number of treatment options, and  is a complaint that carries with it a high risk of complications and morbidity.  The differential diagnosis includes [place] embolism, ACS, MSK.   Co morbidities: Discussed in HPI   Brief History:  Patient presents to the ED with a chief complaint of chest pain which began 4 days ago, did have some URI symptoms.  She is currently postpartum 2 weeks after a C-section.  Evaluated by OB/GYN and referred to the ED to rule out pulmonary embolism.  EMR reviewed including pt PMHx, past surgical history and past visits to ER.   See HPI for more details   Lab Tests:  I ordered and independently interpreted labs.  The pertinent results include:    I personally reviewed all laboratory work and imaging. Metabolic panel without any acute abnormality specifically kidney function within normal limits and no significant electrolyte abnormalities. CBC without leukocytosis or significant anemia.  Lipase level is normal.  LFTs are within normal limits.  Second troponin is unremarkable.   Imaging Studies:  Chest x-ray without any acute findings.   Cardiac Monitoring:  The patient was maintained on a cardiac monitor.  I personally viewed  and interpreted the cardiac monitored which showed an underlying rhythm of: NSR EKG non-ischemic  Medicines ordered:  I ordered medication including toradol  for pain control Reevaluation of the patient after these medicines showed that the patient improved I have reviewed the patients home medicines and have made adjustments as needed  Reevaluation:  After the interventions noted above I re-evaluated patient and found that they have :stayed the same   Social Determinants of Health:  The patient's social determinants of health were a factor in the care of this patient    Problem List / ED Course:  Patient here with 4 days of chest pain substernal radiating to her left chest under her rib.  Evaluated by OB/GYN and sent to the ED for further evaluation rule out pulmonary embolism.  CBC was unremarkable, CMP without any electrolyte derangement.  Creatinine levels within normal limits.  No urinary symptoms, no CVA tenderness to suggest infection.  She arrived to the ED hemodynamically stable.  Patient is currently not on any oral contraceptives, she does not have any prior history of pulmonary embolism.  Exam is pretty benign no tenderness along the chest, however there is significant tenderness on the epigastric region with some guarding and rebound noted.  Discussed obtaining further imaging with CT angio along with CT abdomen and pelvis. CT of her chest did not show any acute pulmonary embolism.  CT of her abdomen without any signs of incarceration of her hernia, no signs of strangulation.  Patient was given Toradol to help with what may be MSK etiology.  Her vitals are within normal limits, she is agreeable with outpatient follow-up with her primary care physician.  Patient hemodynamically stable for discharge. Note, patient is currently not breast-feeding and is formula feeding, I did feel that Toradol was safe to provide her in the emergency department.  Dispostion:  After consideration  of the diagnostic results and the patients response to treatment, I feel that the patent would benefit from continue RICE therapy.    Portions of this note were generated with Scientist, clinical (histocompatibility and immunogenetics)Dragon dictation software. Dictation errors may occur despite best attempts at proofreading.   Final Clinical Impression(s) / ED Diagnoses Final diagnoses:  Epigastric pain    Rx / DC Orders ED Discharge Orders     None         Claude MangesSoto, Braxxton Stoudt, PA-C  01/05/22 2035    Pricilla Loveless, MD 01/06/22 [phone]

## 2022-01-06 ENCOUNTER — Telehealth: Payer: Self-pay | Admitting: General Practice

## 2022-01-06 NOTE — Telephone Encounter (Signed)
Transition Care Management Unsuccessful Follow-up Telephone Call  Date of discharge and from where:  01/05/22 from [facility]  Attempts:  1st Attempt  Reason for unsuccessful TCM follow-up call:  No answer/busy

## 2022-01-09 NOTE — Telephone Encounter (Signed)
Transition Care Management Unsuccessful Follow-up Telephone Call  Date of discharge and from where:  01/05/22 from [facility]  Attempts:  2nd Attempt  Reason for unsuccessful TCM follow-up call:  No answer/busy

## 2022-01-11 ENCOUNTER — Encounter: Payer: 59 | Admitting: Medical-Surgical

## 2022-01-11 NOTE — Telephone Encounter (Signed)
Transition Care Management Unsuccessful Follow-up Telephone Call  Date of discharge and from where:  01/05/22 from [facility]  Attempts:  3rd Attempt  Reason for unsuccessful TCM follow-up call:  No answer/busy

## 2022-01-27 ENCOUNTER — Other Ambulatory Visit: Payer: Self-pay

## 2022-02-06 ENCOUNTER — Other Ambulatory Visit: Payer: Self-pay

## 2022-02-23 ENCOUNTER — Encounter: Payer: 59 | Admitting: Medical-Surgical

## 2022-04-04 ENCOUNTER — Encounter: Payer: 59 | Admitting: Medical-Surgical

## 2022-07-20 ENCOUNTER — Encounter: Payer: 59 | Admitting: Medical-Surgical

## 2022-09-12 IMAGING — DX DG KNEE COMPLETE 4+V*L*
4 series · 4 of 4 positions shown · non-contrast
Comparison: None.

CLINICAL DATA: Patellofemoral syndrome of left knee.

EXAM:
LEFT KNEE - COMPLETE 4+ VIEW; RIGHT KNEE - 1-2 VIEW, including
lateral and sunrise view of the left knee.

[tunnel]
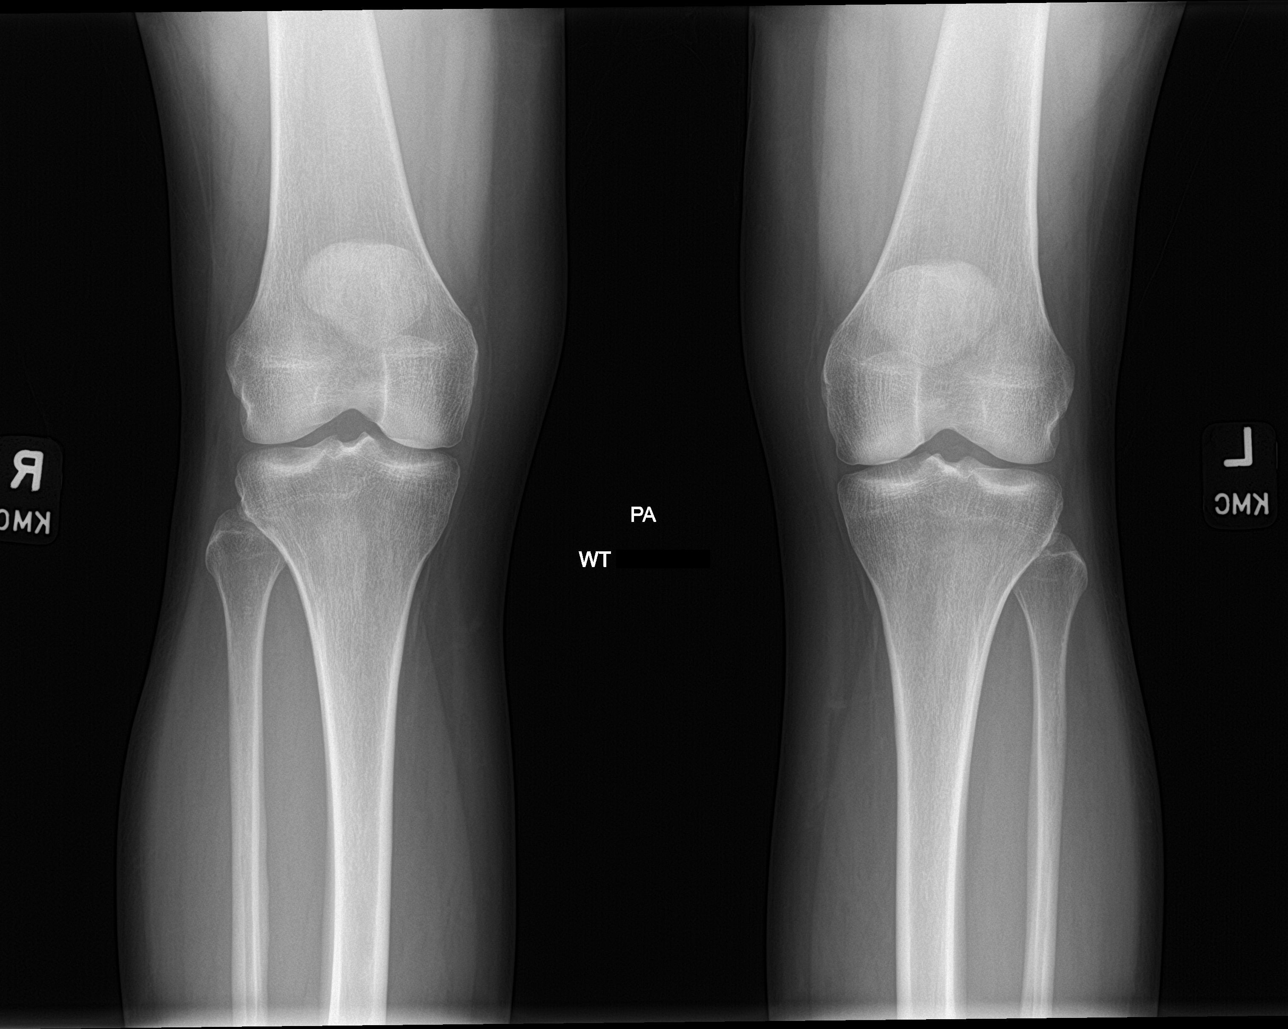

[knee lat]
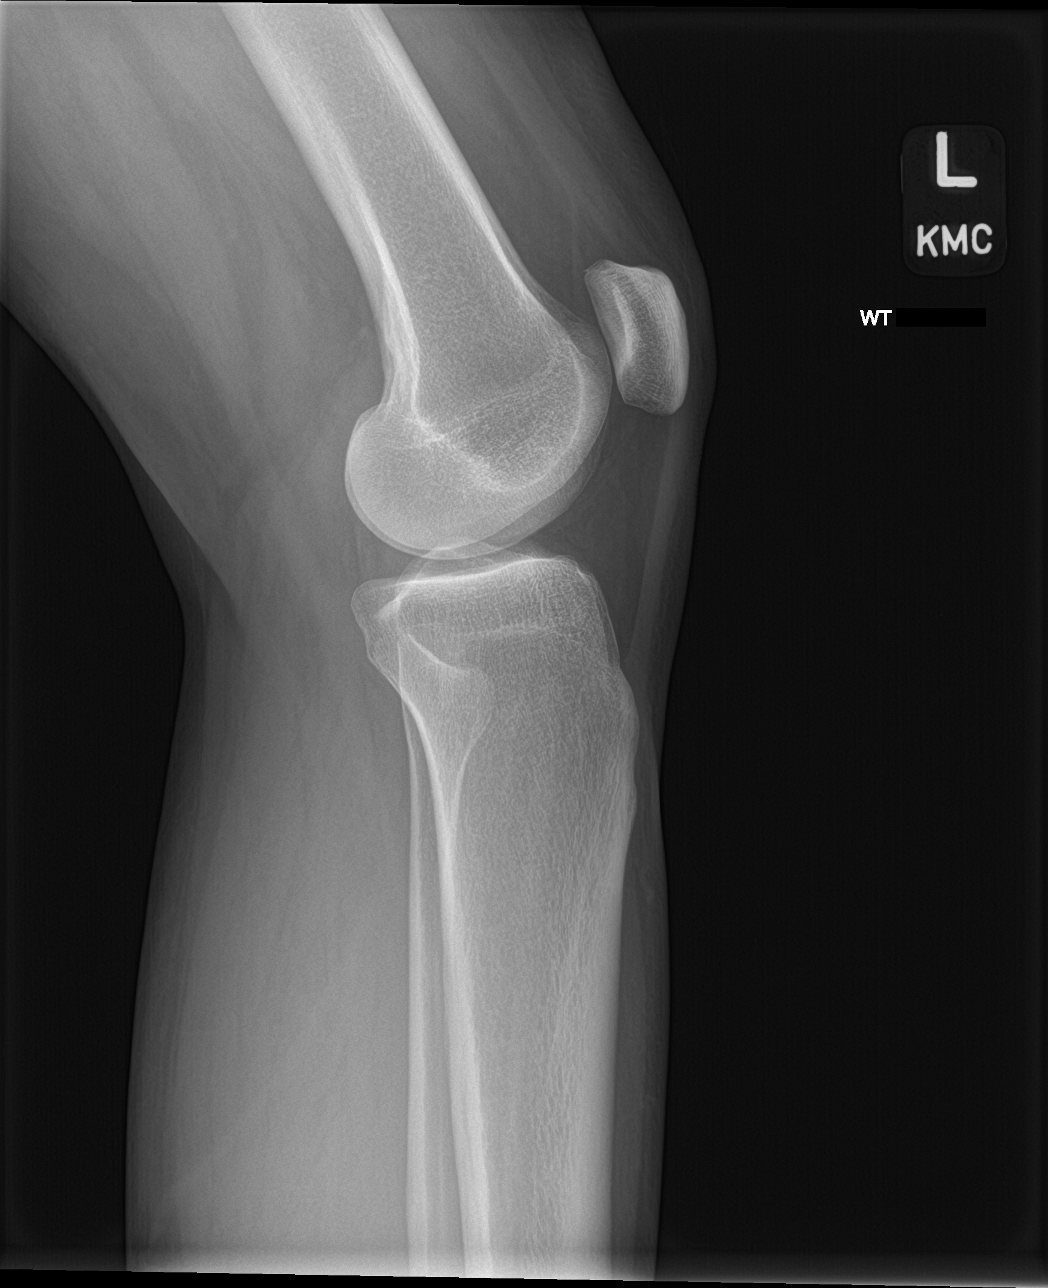

[knee sunrise]
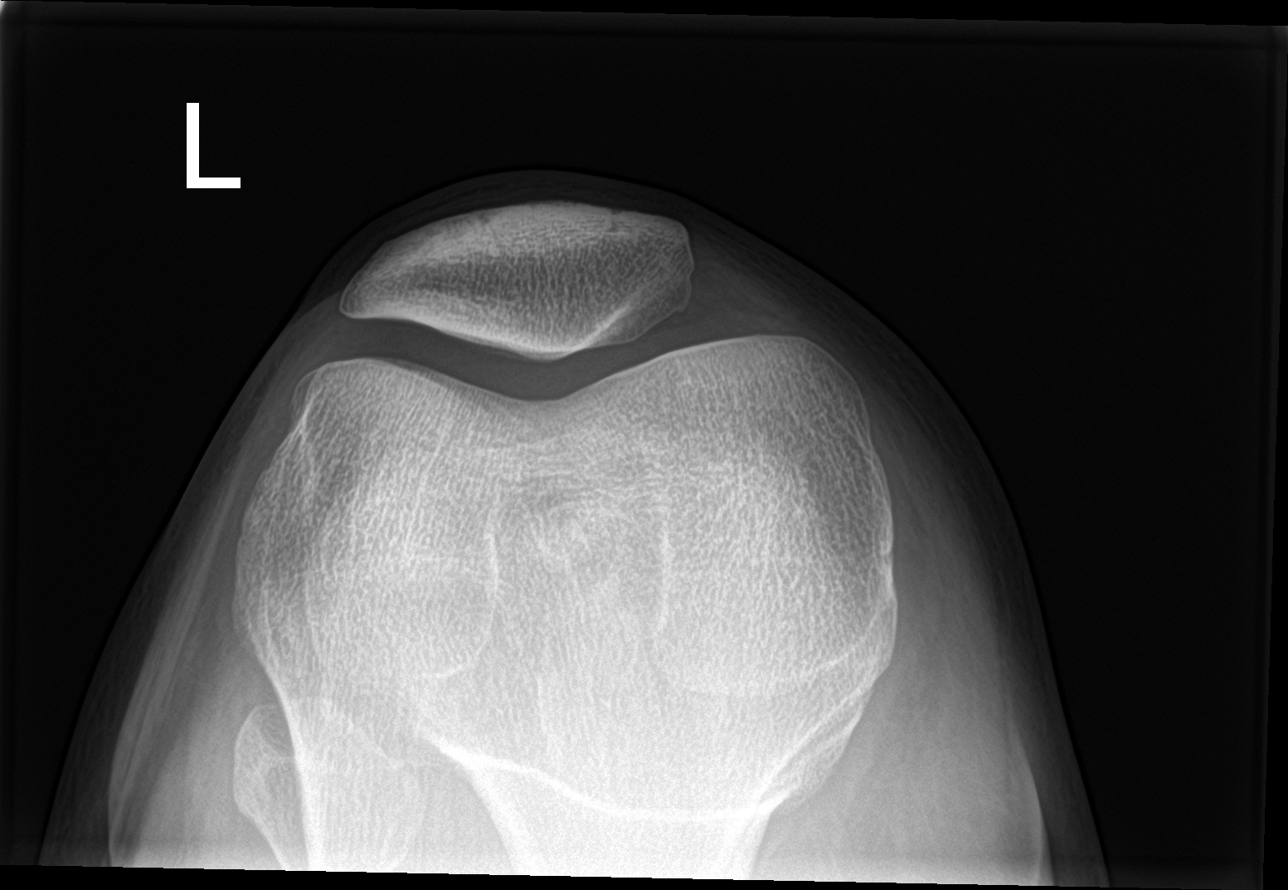

[knee ap bilat standing]
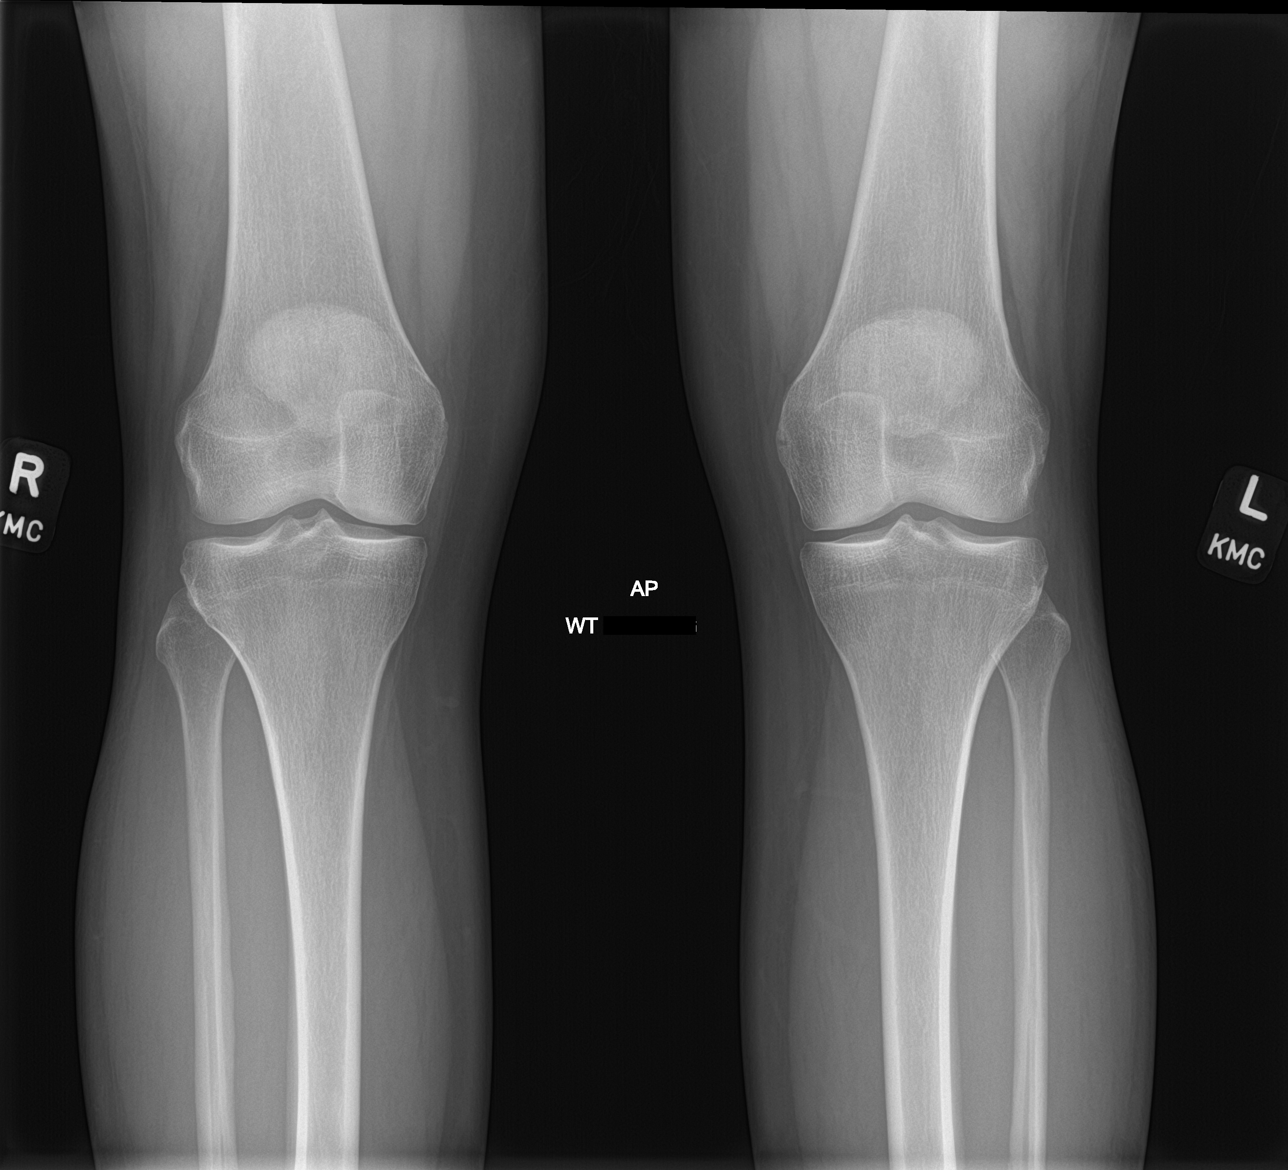

[4 of 4 positions shown; findings below may reference images not displayed]

FINDINGS: No evidence of fracture, dislocation, or joint effusion. No evidence
of arthropathy or other focal bone abnormality. Soft tissues are
unremarkable.

No asymmetry or joint space narrowing on the weight-bearing view of
bilateral knees. Q angle measured on AP weight-bearing views of 8
degrees bilaterally.
IMPRESSION: 1. No acute fracture dislocation of the bones of the left knee.
2. Q angle measured on AP weight-bearing views of 8 degrees
bilaterally. Otherwise unremarkable bilateral knee weightbearing
views.
